# Patient Record
Sex: Male | Born: 1960
Health system: Southern US, Community
[De-identification: ages and names within clinical notes are randomized; demographics above are authoritative.]

## PROBLEM LIST (undated history)

## (undated) DIAGNOSIS — M1990 Unspecified osteoarthritis, unspecified site: Secondary | ICD-10-CM

## (undated) DIAGNOSIS — G4730 Sleep apnea, unspecified: Secondary | ICD-10-CM

## (undated) DIAGNOSIS — E291 Testicular hypofunction: Secondary | ICD-10-CM

## (undated) DIAGNOSIS — R51 Headache: Secondary | ICD-10-CM

## (undated) DIAGNOSIS — F329 Major depressive disorder, single episode, unspecified: Secondary | ICD-10-CM

## (undated) DIAGNOSIS — F32A Depression, unspecified: Secondary | ICD-10-CM

## (undated) DIAGNOSIS — Z5189 Encounter for other specified aftercare: Secondary | ICD-10-CM

## (undated) DIAGNOSIS — R519 Headache, unspecified: Secondary | ICD-10-CM

## (undated) DIAGNOSIS — G8929 Other chronic pain: Secondary | ICD-10-CM

## (undated) DIAGNOSIS — E7800 Pure hypercholesterolemia, unspecified: Secondary | ICD-10-CM

## (undated) HISTORY — PX: FRACTURE SURGERY: SHX138

## (undated) HISTORY — DX: Other chronic pain: G89.29

## (undated) HISTORY — DX: Encounter for other specified aftercare: Z51.89

## (undated) HISTORY — DX: Headache, unspecified: R51.9

## (undated) HISTORY — DX: Headache: R51

## (undated) HISTORY — DX: Sleep apnea, unspecified: G47.30

## (undated) HISTORY — DX: Depression, unspecified: F32.A

## (undated) HISTORY — DX: Major depressive disorder, single episode, unspecified: F32.9

## (undated) HISTORY — DX: Pure hypercholesterolemia, unspecified: E78.00

## (undated) HISTORY — DX: Unspecified osteoarthritis, unspecified site: M19.90

## (undated) HISTORY — DX: Testicular hypofunction: E29.1

---

## 1992-04-11 HISTORY — PX: SPINE SURGERY: SHX786

## 2005-08-16 ENCOUNTER — Ambulatory Visit: Payer: Self-pay | Admitting: Cardiology

## 2005-09-21 ENCOUNTER — Encounter: Payer: Self-pay | Admitting: Pulmonary Disease

## 2005-09-21 ENCOUNTER — Ambulatory Visit (HOSPITAL_BASED_OUTPATIENT_CLINIC_OR_DEPARTMENT_OTHER): Admission: RE | Admit: 2005-09-21 | Discharge: 2005-09-21 | Payer: Self-pay | Admitting: Cardiology

## 2005-09-22 ENCOUNTER — Ambulatory Visit: Payer: Self-pay | Admitting: Pulmonary Disease

## 2006-01-02 ENCOUNTER — Ambulatory Visit: Payer: Self-pay | Admitting: Pulmonary Disease

## 2006-10-06 ENCOUNTER — Ambulatory Visit: Payer: Self-pay | Admitting: Cardiology

## 2006-10-18 ENCOUNTER — Ambulatory Visit: Payer: Self-pay | Admitting: Pulmonary Disease

## 2006-12-01 ENCOUNTER — Ambulatory Visit: Payer: Self-pay | Admitting: Pulmonary Disease

## 2006-12-13 ENCOUNTER — Ambulatory Visit (HOSPITAL_BASED_OUTPATIENT_CLINIC_OR_DEPARTMENT_OTHER): Admission: RE | Admit: 2006-12-13 | Discharge: 2006-12-13 | Payer: Self-pay | Admitting: Pulmonary Disease

## 2006-12-13 ENCOUNTER — Encounter: Payer: Self-pay | Admitting: Pulmonary Disease

## 2007-01-01 ENCOUNTER — Ambulatory Visit: Payer: Self-pay | Admitting: Pulmonary Disease

## 2007-01-11 ENCOUNTER — Ambulatory Visit: Payer: Self-pay | Admitting: Pulmonary Disease

## 2007-03-28 DIAGNOSIS — G4733 Obstructive sleep apnea (adult) (pediatric): Secondary | ICD-10-CM | POA: Insufficient documentation

## 2007-06-19 ENCOUNTER — Ambulatory Visit: Payer: Self-pay | Admitting: Pulmonary Disease

## 2007-06-19 DIAGNOSIS — G4710 Hypersomnia, unspecified: Secondary | ICD-10-CM | POA: Insufficient documentation

## 2008-01-04 ENCOUNTER — Ambulatory Visit: Payer: Self-pay | Admitting: Pulmonary Disease

## 2008-02-19 ENCOUNTER — Telehealth (INDEPENDENT_AMBULATORY_CARE_PROVIDER_SITE_OTHER): Payer: Self-pay | Admitting: *Deleted

## 2008-03-26 ENCOUNTER — Telehealth (INDEPENDENT_AMBULATORY_CARE_PROVIDER_SITE_OTHER): Payer: Self-pay | Admitting: *Deleted

## 2008-07-08 ENCOUNTER — Encounter: Payer: Self-pay | Admitting: Pulmonary Disease

## 2008-07-14 ENCOUNTER — Telehealth (INDEPENDENT_AMBULATORY_CARE_PROVIDER_SITE_OTHER): Payer: Self-pay | Admitting: *Deleted

## 2008-07-21 ENCOUNTER — Telehealth (INDEPENDENT_AMBULATORY_CARE_PROVIDER_SITE_OTHER): Payer: Self-pay | Admitting: *Deleted

## 2008-12-01 ENCOUNTER — Telehealth: Payer: Self-pay | Admitting: Pulmonary Disease

## 2008-12-05 ENCOUNTER — Ambulatory Visit: Payer: Self-pay | Admitting: Pulmonary Disease

## 2010-03-10 ENCOUNTER — Emergency Department (HOSPITAL_COMMUNITY)
Admission: EM | Admit: 2010-03-10 | Discharge: 2010-03-10 | Payer: Self-pay | Source: Home / Self Care | Admitting: Emergency Medicine

## 2010-03-10 ENCOUNTER — Encounter: Payer: Self-pay | Admitting: Cardiology

## 2010-03-11 ENCOUNTER — Ambulatory Visit: Payer: Self-pay | Admitting: Cardiology

## 2010-03-11 DIAGNOSIS — R079 Chest pain, unspecified: Secondary | ICD-10-CM | POA: Insufficient documentation

## 2010-03-22 ENCOUNTER — Telehealth (INDEPENDENT_AMBULATORY_CARE_PROVIDER_SITE_OTHER): Payer: Self-pay | Admitting: Radiology

## 2010-03-23 ENCOUNTER — Ambulatory Visit: Payer: Self-pay

## 2010-03-23 ENCOUNTER — Encounter: Payer: Self-pay | Admitting: Cardiology

## 2010-03-23 ENCOUNTER — Encounter (HOSPITAL_COMMUNITY)
Admission: RE | Admit: 2010-03-23 | Discharge: 2010-05-11 | Payer: Self-pay | Source: Home / Self Care | Attending: Cardiology | Admitting: Cardiology

## 2010-03-29 ENCOUNTER — Telehealth: Payer: Self-pay | Admitting: Cardiology

## 2010-05-11 NOTE — Assessment & Plan Note (Signed)
Summary: rov for osa and idiopathic hypersomnia   Referred by:  Dohmeier/Hochrein  Chief Complaint:  Pt is here for a follow up.  Pt states he uses his cpap machine 3 to 4 nights per week.  Pt denied any complaints with pressure or mask.  Pt states Provigil causes pt to be irritable, forgetful, and short-tempered. Pt would like to discuss possible alternative meds. Pt states he is exercising more and also quit smoking!!.  History of Present Illness: The pt comes in today for f/u of his osa and idiopathic hypersomnia.  He has been wearing cpap comnpliantly for 4-5 nights a week, and has been taking provigil during the day for his hypersomnia.  He is wondering if provigil can cause changes in his personality and temper, but he is also in the process of smoking cessation, which can contribute.  I also pointed out to him that 2-3 nights of not wearing cpap can also contribute to chronic sleep deprivation.     Current Allergies: No known allergies     Risk Factors:  Tobacco use:  quit   Review of Systems      See HPI   Vital Signs:  Patient Profile:   50 Years Old Male Weight:      194.50 pounds O2 Sat:      96 % O2 treatment:    Room Air Temp:     98.1 degrees F oral Pulse rate:   62 / minute BP sitting:   116 / 80  (left arm) Cuff size:   regular  Vitals Entered By: Cyndia Diver LPN (January 04, 2008 3:37 PM)             Comments Medications reviewed with patient Cyndia Diver LPN  January 04, 2008 3:38 PM      Physical Exam  General:     thin male in nad Nose:     no skin breakdown or pressure necrosis from the cpap mask      Impression & Recommendations:  Problem # 1:  OBSTRUCTIVE SLEEP APNEA (ICD-327.23) The pt has no compliants about the cpap, and I have asked him to try and increase his compliance if possible.  Problem # 2:  HYPERSOMNIA, IDIOPATHIC (ICD-780.54) The provigil has greatly improved his daytime alertness, and has allowed him to  function.  I wonder if his symptoms that he is describing are more due to lack of complete compliance with cpap, or possibly due to smoking cessation.  I am willing to try him on nuvigil to see if these symptoms persist.   Patient Instructions: 1)  change provigil to nuvigil 150mg  a day to see if less side effects. 2)  wear cpap as much as possible. 3)  f/u with me in 6mos or sooner if problems   ]

## 2010-05-11 NOTE — Progress Notes (Signed)
Summary: PRESCRIPT  LMTCBx1- returned call  Phone Note Call from Patient   Caller: Patient Call For: [facility] Summary of Call: NEED PRESCRIPT FOR NURIGIL 150 [facility] [id number] Initial call taken by: Rickard Patience,  February 19, 2008 2:53 PM  Follow-up for Phone Call        Pt was switched from Provigil to [place] on 01-04-08. Just need to verifiy pt doing good with switch and need to ok with [facility] for refills.  LM with pt's family member for pt to call me back.  Cyndia Diver LPN  February 19, 2008 4:23 PM  pt's daughter Tilda Burrow returned call from yesterday. call home # [phone] or after 1 hr call cell [phone]. Tivis Ringer  February 20, 2008 10:11 AM    Additional Follow-up for Phone Call Additional follow up Details #1::        pt never had rx for nuvigil, per kc call in nuvigil 150 1 by mouth once daily #30 no refills let kc know how this works for him-[place]--daughter aware med called in and to call and give kc feed back on this Additional Follow-up by: Philipp Deputy CMA,  February 20, 2008 4:31 PM    New/Updated Medications: NUVIGIL 150 MG TABS (ARMODAFINIL) 1 by mouth once daily   Prescriptions: NUVIGIL 150 MG TABS (ARMODAFINIL) 1 by mouth once daily  #30 x 0   Entered by:   Philipp Deputy CMA   Authorized by:   Barbaraann Share MD   Signed by:   Philipp Deputy CMA on 02/20/2008   Method used:   Telephoned to ...       [facility]  [address]  [id number]       [address]       [place], [place]  [address]       Ph: [phone] or [phone]       Fax: [phone]   RxID:   [id number]

## 2010-05-11 NOTE — Assessment & Plan Note (Signed)
Summary: f/u osa/idiopathic hypersomnia   Referred by:  Dohmeier/Hochrein  Chief Complaint:  Follow up. pt denied any complaints.  .  History of Present Illness: the patient comes in today for follow-up of his sleep apnea as well as idiopathic hypersomnia.  The patient's been compliant with CPAP, and is actually doing much better with the device from last year.  He feels that it has helped him sleep better during the night and does have some improvement in daytime sleepiness.  He has continued to have persistent daytime sleepiness despite this, and has been documented by MSLT (but not consistent with narcolepsy).  There have been no other etiologies found, but I remain a little concerned whether there is something neurologic going on here?  The pt is currently being treated with provigil, which helps significantly.  He does fairly well in the am, but has difficulty in the late afternoons.     Current Allergies: No known allergies   Past Medical History:    Current Problems:     OBSTRUCTIVE SLEEP APNEA (ICD-327.23)           Vital Signs:  Patient Profile:   50 Years Old Male Weight:      187 pounds O2 Sat:      95 % O2 treatment:    Room Air Temp:     98.1 degrees F oral Pulse rate:   66 / minute BP sitting:   122 / 70  (left arm) Cuff size:   large  Vitals Entered By: Cyndia Diver LPN (June 19, 2007 4:06 PM)             Comments Medications reviewed with patient. ..................................................................Marland KitchenCyndia Diver LPN  June 19, 2007 4:07 PM      Physical Exam  General:     thin wm in nad Nose:      no skin breakdown or pressure necrosis from the CPAP mask     Impression & Recommendations:  Problem # 1:  OBSTRUCTIVE SLEEP APNEA (ICD-327.23) the patient is doing much better with CPAP compliance.  He feels that it is helping his sleep and daytime symptoms.  He is having no difficulty with the pressure or mask fit.   Problem # 2:   HYPERSOMNIA, IDIOPATHIC (ICD-780.54) the patient is staying functional at this time, but does require Provigil in order for him to accomplish this.  I have also talked with him about drinking caffeinated beverages in order to help with alertness, as well as taking short daytime naps if able to try and refresh him.  It is absolutely critical that he gets 7 to 8 hours of sleep nightly.    Patient Instructions: 1)  f/u 6 mos or sooner if problems. 2)  no change in meds. 3)  use caffeine as needed, and take short naps during the day if able    ]

## 2010-05-11 NOTE — Progress Notes (Signed)
Summary: nuvigil- pt out  Phone Note Call from Patient   Caller: Patient Call For: clance Summary of Call: pt has made an appt for this week to see kc. he is out of nuvigil and would appreciate this be called in asap. [facility] [address] [facility] [id number] Initial call taken by: Tivis Ringer,  December 01, 2008 11:22 AM  Follow-up for Phone Call        megan---is this ok to send the rx in for nuvigil for this pt--he is scheduled to see [facility] on friday.  thanks Marijo File CMA  December 01, 2008 11:24 AM   Will forward to [facility] to ask him.  Aundra Millet Reynolds LPN  December 01, 2008 11:58 AM   Additional Follow-up for Phone Call Additional follow up Details #1::        ok to send him #30 with no fills. Additional Follow-up by: Barbaraann Share MD,  December 01, 2008 4:37 PM    Prescriptions: NUVIGIL 150 MG TABS (ARMODAFINIL) 1 by mouth once daily  #30 x 0   Entered by:   Carron Curie CMA   Authorized by:   Barbaraann Share MD   Signed by:   Carron Curie CMA on 12/01/2008   Method used:   Telephoned to ...       [facility] [address] [id number]       [address]       [place], [place]  [address]       Ph: [phone] or [phone]       Fax: [phone]   RxID:   [id number]

## 2010-05-11 NOTE — Progress Notes (Signed)
Summary: re-faxing CMN  Phone Note From Other Clinic   Caller: ann w/ [facility] Call For: clance Summary of Call: caller: ann is re-faxing request for CMN. says she faxed one in march and hasn't received anything back. caller's # is [phone] x [phone].     Initial call taken by: Tivis Ringer,  July 14, 2008 12:14 PM  Follow-up for Phone Call        Aundra Millet could you please look to see if we have received this CMN.  Thanks. I will be glad to call them back and advise CMN received and for them not to refax.  I looked through [facility] stack of paperwork and did not see anything on this pt.  Called Ann at [place] and LM to re-fax paperwork.  Aundra Millet Reynolds LPN  July 14, 4780 2:58 PM  Follow-up by: Cloyde Reams RN,  July 14, 2008 12:21 PM  Additional Follow-up for Phone Call Additional follow up Details #1::        received paperwork.  Put in [facility] cabinet for him to sign.  Arman Filter LPN  July 14, 9560 3:32 PM

## 2010-05-11 NOTE — Assessment & Plan Note (Signed)
Summary: rov for osa/idiopathic hypersomnia   Copy to:  Dohmeier/Hochrein Primary Provider/Referring Provider:  [facility] [address]  CC:  Pt is here for a f/u appt.   Pt states he is wearing his cpap machine every night and "all night long."  Pt denied any complaints with mask or pressure.  Pt denied any new complaints.  Pt requests rx for Nuvigil.  History of Present Illness: The pt comes in today for f/u of his osa and idiopathic hypersomnia.  He is wearing cpap compliantly, and taking nuvigil qam to maintain daytime functionality.  He is maintaining adequate sleep hygeine.  He has issues 1-2 nights out of 7 where he will awaken about 4am with a few mask leaks, and pull off and sleep for another hour or so.  The other nights he wears until alarm clock goes off.  He thinks this is simply due to shifting of mask during sleep.  Medications Prior to Update: 1)  Nuvigil 150 Mg Tabs (Armodafinil) .Marland Kitchen.. 1 By Mouth Once Daily  Allergies (verified): No Known Drug Allergies  Review of Systems      See HPI  Vital Signs:  Patient profile:   50 year old male Height:      72 inches Weight:      195.13 pounds BMI:     26.56 O2 Sat:      98 % on Room air Temp:     98.2 degrees F oral Pulse rate:   80 / minute BP sitting:   122 / 90  (right arm) Cuff size:   regular  Vitals Entered By: Arman Filter LPN (December 05, 2008 9:02 AM)  O2 Flow:  Room air CC: Pt is here for a f/u appt.   Pt states he is wearing his cpap machine every night, "all night long."  Pt denied any complaints with mask or pressure.  Pt denied any new complaints.  Pt requests rx for Nuvigil Comments Medications reviewed with patient Arman Filter LPN  December 05, 2008 9:02 AM    Physical Exam  General:  wd male in nad Nose:  no skin breakdown or pressure necrosis from cpap mask Heart:  rrr Extremities:  no edema  Neurologic:  alert, but clearly sleepy moves all 4.   Impression & Recommendations:  Problem #  1:  OBSTRUCTIVE SLEEP APNEA (ICD-327.23) the pt is wearing cpap compliantly, and misses out on only a few hrs 2 mornings a week.  His mask fits well, and he has no pressure issues.   Problem # 2:  HYPERSOMNIA, IDIOPATHIC (ICD-780.54) the pt is at least functioning with nuvigil.  I have reminded him to  keep up with his sleep hygeine, naps when able, and caffeine as needed.  Other Orders: Est. Patient Level III (63016)  Patient Instructions: 1)  Please schedule a follow-up appointment in 6 months. 2)  let me know if problems in interim 3)  please see primary md about physical.

## 2010-05-11 NOTE — Progress Notes (Signed)
Summary: Nuvigil refill  Phone Note Call from Patient Call back at [phone]   Caller: Patient Call For: clance Reason for Call: Refill Medication, Talk to Nurse Summary of Call: pt doing well on Nuvigil, pharmacy states they faxed request for refill on 03/25/08.  Not showing in computer, can we call in approval w[facility]refills? [facility] [address] Initial call taken by: Eugene Gavia,  March 26, 2008 4:35 PM  Follow-up for Phone Call        Pt states he is doing some better on Nuvigil. The irritability is not as bad. OK to refill this med? Michel Bickers [facility]  March 26, 2008 4:44 PM  Additional Follow-up for Phone Call Additional follow up Details #1::        ok to fill nuvigil with 6 fills (30)    Pt aware RX called to his pharmacy. Michel Bickers [facility]  March 26, 2008 4:56 PM      Prescriptions: NUVIGIL 150 MG TABS (ARMODAFINIL) 1 by mouth once daily  #30 x 5   Entered by:   Michel Bickers CMA   Authorized by:   Barbaraann Share MD   Signed by:   Michel Bickers CMA on 03/26/2008   Method used:   Telephoned to ...       [facility]  [address]  [id number]       [address]       [place], [place]  [address]       Ph: [phone] or [phone]       Fax: [phone]   RxID:   [id number]

## 2010-05-11 NOTE — Letter (Signed)
Summary: CMN for CPAP Supplies/[facility]  CMN for CPAP Supplies/[facility]   Imported By: Sherian Rein 07/25/2008 08:38:58  _____________________________________________________________________  External Attachment:    Type:   Image     Comment:   External Document

## 2010-05-11 NOTE — Assessment & Plan Note (Signed)
Summary: np6/pt last seen by dr hochrein/lg   Visit Type:  Follow-up Primary Provider:  Dr. Faustino Congress   History of Present Illness: 50 yo with history of chronic fatigue, OSA, and smoking presents for evaluation of chest pain.  Patient woke up in the early morning on 11/30 with central chest pressure and shortness of breath that lasted 1.5 hours total.  He went back to bed, then got up later and went to [facility].  There, he was thought to have a T wave abnormality on his ECG (this ECG is not available) and was sent to the ER.  At the [facility] ER, his ECGs were normal.  Cardiac enyzmes were negative x 2.  He has had no chest pain since that time.  He had no pain prior to this.  He saw cardiology in the past for fatigue with unremarkable workup at that time.  He continues to smoke cigarettes.    ECG: NSR, normal  Labs (03/10/10): cardiac enzymes negative x 2, K 4.5, creatinine 1.01  Current Medications (verified): 1)  Androgel .... Uad  Allergies (verified): No Known Drug Allergies  Past History:  Past Medical History: 1. OBSTRUCTIVE SLEEP APNEA (ICD-327.23): uses CPAP 2. Mild Bradycardia: short pauses in his sleep on past monitor, may have been related to OSA.  3. Chronic fatigue 4. Low testosterone 5. Active smoker  Family History: Grandfather with MI at 67  Social History: Tobacco Use - Yes, 1/2 ppd Married, lives in [place] Works for [employer] in [place]  Review of Systems       All systems reviewed and negative except as per HPI.   Vital Signs:  Patient profile:   50 year old male Height:      72 inches Weight:      200 pounds BMI:     27.22 Pulse rate:   63 / minute BP sitting:   130 / 82  (left arm)  Vitals Entered By: Laurance Flatten CMA (March 11, 2010 4:11 PM)  Physical Exam  General:  Well developed, well nourished, in no acute distress. Head:  normocephalic and atraumatic Nose:  no deformity, discharge, inflammation, or lesions Mouth:   Teeth, gums and palate normal. Oral mucosa normal. Neck:  Neck supple, no JVD. No masses, thyromegaly or abnormal cervical nodes. Lungs:  Clear bilaterally to auscultation and percussion. Heart:  Non-displaced PMI, chest non-tender; regular rate and rhythm, S1, S2 without murmurs, rubs or gallops. Carotid upstroke normal, no bruit. Pedals normal pulses. No edema, no varicosities. Abdomen:  Bowel sounds positive; abdomen soft and non-tender without masses, organomegaly, or hernias noted. No hepatosplenomegaly. Msk:  Back normal, normal gait. Muscle strength and tone normal. Extremities:  No clubbing or cyanosis. Neurologic:  Alert and oriented x 3. Skin:  Intact without lesions or rashes. Psych:  Normal affect.   Impression & Recommendations:  Problem # 1:  CHEST PAIN UNSPECIFIED (ICD-786.50) Prolonged episode of somewhat atypical chest pain.  His main risk factor is smoking.  He was said to have a T wave abnormality on ECG at PCP's office on the days he had the chest pain.  However, ECG at the ER later that day and ECG today are both normal.  He will take ASA 81 mg daily and I will arrange an ETT-myoview.   Problem # 2:  SMOKER I strongly encouraged him to quit.  He wants to try an electronic cigarrette.   Will call for last lipid draw from PCP.   Other Orders:  Nuclear Stress Test (Nuc Stress Test)  Patient Instructions: 1)  Your physician has recommended you make the following change in your medication:  2)  Start Aspirin 81mg  daily--this should be enteric coated. 3)  Use the electronic cigarette to help you stop smoking. 4)  Your physician has requested that you have an exercise stress myoview.  For further information please visit [email or web address]  Please follow instruction sheet, as given. 5)  Your physician recommends that you schedule a follow-up appointment as needed with Dr Shirlee Latch.

## 2010-05-11 NOTE — Progress Notes (Signed)
Summary: looking for paperwork  Phone Note Outgoing Call Call back at [phone] [phone]   Call placed by: [facility] Call placed to: clance Summary of Call: looking for paperwork on pt's cpap supplies...faxed over on July 08, 2008, has been faxed two more times since then.  Patton Salles is rep that called from [place]. Initial call taken by: Eugene Gavia,  July 21, 2008 1:25 PM  Follow-up for Phone Call        Aundra Millet, do you have this paperwork?  Could you please have KC sign.  Thanks:)   yes, we have paperwork.  i'll have kc sign it at the end of the day today.  Aundra Millet Reynolds LPN  July 21, 2008 2:40 PM   Follow-up by: Cloyde Reams RN,  July 21, 2008 2:14 PM  Additional Follow-up for Phone Call Additional follow up Details #1::        Called advised paperwork has been received and per Aundra Millet will be signed by end of the day, and we will fax back. Additional Follow-up by: Cloyde Reams RN,  July 21, 2008 3:01 PM

## 2010-05-13 NOTE — Progress Notes (Signed)
Summary: nuc pre-procedure  Phone Note Outgoing Call   Call placed by: Harlow Asa CNMT Call placed to: Patient Reason for Call: Confirm/change Appt Summary of Call: Reviewed information on Myoview Information Sheet (see scanned document for further details).  Spoke with patient.      Nuclear Med Background Indications for Stress Test: Evaluation for Ischemia, Post Hospital  Indications Comments: 03/10/10 [facility] CP / Pressure ( - enzymes)   History Comments: OSA ( CPAP)  Symptoms: Chest Pain, Chest Pressure    Nuclear Pre-Procedure Cardiac Risk Factors: Family History - CAD, Smoker Height (in): 72

## 2010-05-13 NOTE — Progress Notes (Signed)
Summary: pt returned call  Phone Note Call from Patient   Caller: Patient [phone] Reason for Call: Talk to Nurse, Lab or Test Results Summary of Call: pt returned call re results -pls call Initial call taken by: Glynda Jaeger,  March 29, 2010 10:47 AM  Follow-up for Phone Call        adv pt of stress test results. pt expressed understanding.  Follow-up by: Claris Gladden RN,  March 29, 2010 11:21 AM

## 2010-05-13 NOTE — Assessment & Plan Note (Signed)
Summary: Cardiology Nuclear Testing  Nuclear Med Background Indications for Stress Test: Evaluation for Ischemia, Post Hospital  Indications Comments: 03/10/10 [facility] CP / Pressure ( - enzymes)   History Comments: OSA ( CPAP)  Symptoms: Chest Pain, Chest Pressure    Nuclear Pre-Procedure Cardiac Risk Factors: Family History - CAD, Smoker Caffeine/Decaff Intake: None NPO After: 7:00 PM Lungs: clear IV 0.9% NS with Angio Cath: 22g     IV Site: R Antecubital IV Started by: Irean Hong, RN Chest Size (in) 44     Height (in): 72 Weight (lb): 197 BMI: 26.81  Nuclear Med Study 1 or 2 day study:  1 day     Stress Test Type:  Stress Reading MD:  Olga Millers, MD     Referring MD:  D.McLean Resting Radionuclide:  Technetium 104m Tetrofosmin     Resting Radionuclide Dose:  11 mCi  Stress Radionuclide:  Technetium 58m Tetrofosmin     Stress Radionuclide Dose:  33 mCi   Stress Protocol Exercise Time (min):  10:00 min     Max HR:  157 bpm     Predicted Max HR:  172 bpm  Max Systolic BP: 181 mm Hg     Percent Max HR:  91.28 %     METS: 11.7 Rate Pressure Product:  32355    Stress Test Technologist:  Milana Na, EMT-P     Nuclear Technologist:  Domenic Polite, CNMT  Rest Procedure  Myocardial perfusion imaging was performed at rest 45 minutes following the intravenous administration of Technetium 58m Tetrofosmin.  Stress Procedure  The patient exercised for 10:00. The patient stopped due to fatigue and denied any chest pain.  There were no significant ST-T wave changes.  Technetium 2m Tetrofosmin was injected at peak exercise and myocardial perfusion imaging was performed after a brief delay.  QPS Raw Data Images:  Acquisition technically good; normal left ventricular size. Stress Images:  Normal homogeneous uptake in all areas of the myocardium. Rest Images:  Normal homogeneous uptake in all areas of the myocardium. Subtraction (SDS):  No evidence of ischemia. Transient  Ischemic Dilatation:  .93  (Normal <1.22)  Lung/Heart Ratio:  .32  (Normal <0.45)  Quantitative Gated Spect Images QGS EDV:  108 ml QGS ESV:  49 ml QGS EF:  54 % QGS cine images:  Normal wall motion.   Overall Impression  Exercise Capacity: Good exercise capacity. BP Response: Normal blood pressure response. Clinical Symptoms: No chest pain ECG Impression: No significant ST segment change suggestive of ischemia. Overall Impression: Normal stress nuclear study with no ischemia or infarction.  Appended Document: Cardiology Nuclear Testing normal study  Appended Document: Cardiology Nuclear Testing [facility]  Appended Document: Cardiology Nuclear Testing adv pt of lab results. Claris Gladden, RN, BSN

## 2010-06-23 LAB — DIFFERENTIAL
Basophils Absolute: 0 10*3/uL (ref 0.0–0.1)
Basophils Relative: 1 % (ref 0–1)
Eosinophils Absolute: 0.2 10*3/uL (ref 0.0–0.7)
Eosinophils Relative: 4 % (ref 0–5)
Lymphocytes Relative: 26 % (ref 12–46)
Lymphs Abs: 1.6 10*3/uL (ref 0.7–4.0)
Monocytes Absolute: 0.5 10*3/uL (ref 0.1–1.0)
Monocytes Relative: 8 % (ref 3–12)
Neutro Abs: 3.7 10*3/uL (ref 1.7–7.7)
Neutrophils Relative %: 62 % (ref 43–77)

## 2010-06-23 LAB — BASIC METABOLIC PANEL
BUN: 7 mg/dL (ref 6–23)
CO2: 29 mEq/L (ref 19–32)
Calcium: 9 mg/dL (ref 8.4–10.5)
Chloride: 104 mEq/L (ref 96–112)
Creatinine, Ser: 1.01 mg/dL (ref 0.4–1.5)
GFR calc Af Amer: >60 mL/min (ref >60)
GFR calc non Af Amer: >60 mL/min (ref >60)
Glucose, Bld: 91 mg/dL (ref 70–99)
Potassium: 4.5 mEq/L (ref 3.5–5.1)
Sodium: 138 mEq/L (ref 135–145)

## 2010-06-23 LAB — POCT CARDIAC MARKERS
CKMB, poc: <1 ng/mL — ABNORMAL LOW (ref 1.0–8.0)
CKMB, poc: <1 ng/mL — ABNORMAL LOW (ref 1.0–8.0)
Myoglobin, poc: 60.9 ng/mL (ref 12–200)
Myoglobin, poc: 62.8 ng/mL (ref 12–200)
Troponin i, poc: 0.06 ng/mL (ref 0.00–0.09)
Troponin i, poc: <0.05 ng/mL (ref 0.00–0.09)

## 2010-06-23 LAB — CBC
HCT: 46.6 % (ref 39.0–52.0)
Hemoglobin: 17 g/dL (ref 13.0–17.0)
MCH: 32.9 pg (ref 26.0–34.0)
MCHC: 36.5 g/dL — ABNORMAL HIGH (ref 30.0–36.0)
MCV: 90.3 fL (ref 78.0–100.0)
Platelets: 141 10*3/uL — ABNORMAL LOW (ref 150–400)
RBC: 5.16 MIL/uL (ref 4.22–5.81)
RDW: 12.7 % (ref 11.5–15.5)
WBC: 6 10*3/uL (ref 4.0–10.5)
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
## 2010-08-24 NOTE — Assessment & Plan Note (Signed)
[facility]                             PULMONARY OFFICE NOTE   CECIL, VANDYKE                          MRN:          [id number]  DATE:10/18/2006                            DOB:          01-14-1961    SUBJECTIVE:  Mr. Tomes comes in today for followup of his obstructive  sleep apnea.  The patient was last seen in September 2007 where he was  started on CPAP for mild obstructive sleep apnea.  The patient was to  return in 4 weeks but unfortunately did not show for the visit and has  not been heard from since.  The patient apparently tried the CPAP  several months and felt that he could not tolerate and therefore  discontinued on his own.  He has been having trouble with significant  daytime sleepiness and lack of concentration that is now affecting his  work.  The patient states the CPAP never really helped him; however, he  never had his pressure optimized, and was also wearing his mask half on  and half off like a Pharmacist, hospital.  Obviously, this would not deliver  appropriate pressure nor would it help him from a sleep apnea  standpoint.   PHYSICAL EXAMINATION:  In general he is a thin male who definitely  appears sleepy.  Blood pressure 120/78, pulse 63, temperature is 97.5.  Weight is 177  pounds.  O2 saturation on room air is 96%.   IMPRESSION:  Excessive daytime sleepiness that may be due to his  obstructive sleep apnea.  I have explained to him again that there are  some patients who are very symptomatic from mild sleep apnea and there  are others who do not even know they have it.  I still think CPAP is the  easiest therapeutic trial for him to see if he has significant  improvement in his daytime sleepiness.  If he does not, after a good  therapeutic trial on CPAP, obviously this is not the etiology for his  daytime sleepiness and we would have to do further work up  to try and  ascertain a specific etiology.  I have explained to him that  he has  really not given CPAP a fair trial,  from both a pressure optimization  standpoint and from appropriate use standpoint.  At this point in time I  have recommended that we give him a trial of an auto-set where the  pressure is variable and may also allow him to tolerate the CPAP better.  I also talked with him about the points of a good mask fit and how there  has to be an adequate seal.  The patient is agreeable to giving this a  try.   PLAN:  1. Will get auto-set machine for the next 3 weeks to see if he has      significant improvement in his symptoms.  I told the patient again,      that if he finds there is poor tolerance, that he needs to call me      immediately so that we  can trouble shoot the device and give this a      fair      chance.  He has voiced understanding.  2. The patient will follow up in 3 weeks or sooner if there is      problems.     Barbaraann Share, MD,FCCP  Electronically Signed    KMC/MedQ  DD: 10/18/2006  DT: 10/18/2006  Job #: [id number]   cc:   Rollene Rotunda, MD, [facility]  Neurolog[facility] [place]

## 2010-08-24 NOTE — Procedures (Signed)
NAME:  Jeremy Guerra, Jeremy Guerra NO.:  [id number]   MEDICAL RECORD NO.:  [id number]          PATIENT TYPE:  OUT   LOCATION:  SLEEP CENTER                 FACILITY:  [facility]   PHYSICIAN:  Barbaraann Share, MD,FCCPDATE OF BIRTH:  06/03/1961   DATE OF STUDY:  12/13/2006                          MULTIPLE SLEEP LATENCY TEST   REFERRING PHYSICIAN:   INDICATION FOR STUDY:  Persistent hypersomnia despite aggressive  treatment of mild obstructive sleep apnea with CPAP.   DATA:   NAP 1:  Sleep onset latency of 5 minutes with latency to REM not  applicable.  Nap time #1 is 8 a.m.   NAP 2:  Time is 10 a.m.  Sleep onset latency is 3 minutes and REM  latency is not applicable.   NAP 3:  Time is 12 p.m.  Sleep onset latency is 1 minute and REM onset  is not applicable.   NAP 4:  Time is 2 p.m.  Sleep onset latency is 8 minutes.  REM onset is  not applicable.   NAP 5:  Time is 4 p.m.  Sleep onset latency is 4.5 minutes.  Latency to  REM is 9.5 minutes.    MEAN SLEEP LATENCY:  4.3 minutes   NUMBER OF REM EPISODES:  One   COMMENTS:  It should be noted the patient has a history of mild  obstructive sleep apnea and has been on optimal CPAP for this.  Despite  treatment, the patient has continued to have significant daytime  hypersomnolence.  It should also be noted the tech noted moderate to  loud intermittent snoring during the study.   IMPRESSIONS/RECOMMENDATIONS:  MSLT shows a mean sleep latency of 4.3  minutes which is consistent with objective daytime sleepiness.  The  patient did have one REM episode that may suggest the possible diagnosis  of narcolepsy.  Clinical correlation is suggested.      Barbaraann Share, MD,FCCP  Diplomate, American Board of Sleep  Medicine  Electronically Signed     KMC/MEDQ  D:  01/01/2007 08:31:28  T:  01/01/2007 11:24:13  Job:  [id number]
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
## 2010-08-27 NOTE — Procedures (Signed)
NAME:  Jeremy Guerra, SICKINGER NO.:  [id number]   MEDICAL RECORD NO.:  [id number]          PATIENT TYPE:  OUT   LOCATION:  SLEEP CENTER                 [facility]ACILITY[facility]  [facility]   PHYSICIAN:  Marcelyn Bruins, M.D. [facility] DATE OF BIRTH:  [birth year]   DATE OF STUDY:  09/21/2005                              NOCTURNAL POLYSOMNOGRAM   REFERRING PHYSICIAN:  Rollene Rotunda, M.D.   INDICATIONS FOR STUDY:  Hypersomnia with sleep apnea.   EPWORTH SCORE:  16.   SLEEP ARCHITECTURE:  The patient had a total sleep time of 310 minutes with  very decreased REM and slow wave sleep.  Sleep onset latency was normal and  8 minutes and REM onset was normal as well as 83 minutes.  Sleep efficiency  was extremely decreased at 69%.   RESPIRATORY DATA:  The patient underwent split night study where he was  found to have 33 obstructive events in the first 161 minutes of sleep.  This  gave him a respiratory disturbance index of 12 events per hour.  The events  occurred in all body positions and there was mild to moderate snoring noted  throughout.  By protocol, the patient was then placed on a medium ResMed  Ultra Mirage full throughout Mirage full face mask and CPAP titration was  initiated.  At a final pressure of 7 cm there was excellent control of both  obstructive events and snoring.   OXYGEN DATA:  The patient had O2 desaturation as low as 89% with his  obstructive event.   CARDIAC DATA:  No clinically significant cardiac arrhythmias.   MOVEMENT-PARASOMNIA:  Small numbers of leg jerks with no significant sleep  disruption.   IMPRESSION/RECOMMENDATIONS:  Split night study reveals mild obstructive  sleep apnea/hypopnea syndrome with a respiratory disturbance index of 12  events per hour during the first half of the night.  The patient was then  placed on a medium ResMed Ultra Mirage full face mask and titrated to a  final pressure of 7 cm.  Other  options for treatment of this degree of sleep apnea  include upper airway  surgery, oral appliance, and also weight loss alone if applicable.  Clinical  correlation is suggested.          ______________________________  Marcelyn Bruins, M.D. [facility]  Diplomate, American Board of Sleep  Medicine     KC/MEDQ  D:  09/22/2005 22:21:27  T:  09/23/2005 00:32:46  Job:  [id number]

## 2011-07-02 ENCOUNTER — Other Ambulatory Visit: Payer: Self-pay | Admitting: *Deleted

## 2011-07-02 MED ORDER — TESTOSTERONE 50 MG/5GM (1%) TD GEL: 5.0000 g | Freq: Every day | TRANSDERMAL | Status: DC

## 2011-08-08 ENCOUNTER — Telehealth: Payer: Self-pay | Admitting: Physician Assistant

## 2011-08-08 MED ORDER — TESTOSTERONE 50 MG/5GM (1%) TD GEL: 5.0000 g | Freq: Every day | TRANSDERMAL | Status: DC

## 2011-08-08 NOTE — Telephone Encounter (Signed)
rx printed

## 2011-08-08 NOTE — Telephone Encounter (Signed)
Rx called in 

## 2011-11-08 ENCOUNTER — Other Ambulatory Visit: Payer: Self-pay | Admitting: Physician Assistant

## 2011-11-08 MED ORDER — TESTOSTERONE 50 MG/5GM (1%) TD GEL: 5.0000 g | Freq: Every day | TRANSDERMAL | Status: DC

## 2012-01-19 ENCOUNTER — Encounter: Payer: Self-pay | Admitting: Family Medicine

## 2012-01-23 ENCOUNTER — Ambulatory Visit (INDEPENDENT_AMBULATORY_CARE_PROVIDER_SITE_OTHER): Payer: Managed Care, Other (non HMO) | Admitting: Family Medicine

## 2012-01-23 ENCOUNTER — Ambulatory Visit: Payer: Managed Care, Other (non HMO)

## 2012-01-23 VITALS — BP 130/80 | HR 80 | Temp 98.1°F | Resp 18 | Ht 71.0 in | Wt 164.2 lb

## 2012-01-23 DIAGNOSIS — L570 Actinic keratosis: Secondary | ICD-10-CM

## 2012-01-23 DIAGNOSIS — F411 Generalized anxiety disorder: Secondary | ICD-10-CM

## 2012-01-23 DIAGNOSIS — Z0000 Encounter for general adult medical examination without abnormal findings: Secondary | ICD-10-CM

## 2012-01-23 DIAGNOSIS — F419 Anxiety disorder, unspecified: Secondary | ICD-10-CM

## 2012-01-23 LAB — COMPREHENSIVE METABOLIC PANEL
ALT: 32 U/L (ref 0–53)
AST: 26 U/L (ref 0–37)
Albumin: 4.8 g/dL (ref 3.5–5.2)
Alkaline Phosphatase: 56 U/L (ref 39–117)
BUN: 7 mg/dL (ref 6–23)
CO2: 28 mEq/L (ref 19–32)
Calcium: 9.8 mg/dL (ref 8.4–10.5)
Chloride: 103 mEq/L (ref 96–112)
Creat: 1.07 mg/dL (ref 0.50–1.35)
Glucose, Bld: 98 mg/dL (ref 70–99)
Potassium: 4.2 mEq/L (ref 3.5–5.3)
Sodium: 142 mEq/L (ref 135–145)
Total Bilirubin: 0.9 mg/dL (ref 0.3–1.2)
Total Protein: 6.9 g/dL (ref 6.0–8.3)

## 2012-01-23 LAB — PSA: PSA: 0.89 ng/mL (ref <=4.00)

## 2012-01-23 LAB — POCT CBC
Granulocyte percent: 80.8 %G — AB (ref 37–80)
HCT, POC: 54.3 % — AB (ref 43.5–53.7)
Hemoglobin: 17.9 g/dL (ref 14.1–18.1)
Lymph, poc: 1.2 (ref 0.6–3.4)
MCH, POC: 31.6 pg — AB (ref 27–31.2)
MCHC: 33 g/dL (ref 31.8–35.4)
MCV: 95.9 fL (ref 80–97)
MID (cbc): 0.4 (ref 0–0.9)
MPV: 13.2 fL (ref 0–99.8)
POC Granulocyte: 6.7 (ref 2–6.9)
POC LYMPH PERCENT: 14.5 %L (ref 10–50)
POC MID %: 4.7 %M (ref 0–12)
Platelet Count, POC: 174 10*3/uL (ref 142–424)
RBC: 5.66 M/uL (ref 4.69–6.13)
RDW, POC: 14.1 %
WBC: 8.3 10*3/uL (ref 4.6–10.2)

## 2012-01-23 LAB — POCT URINALYSIS DIPSTICK
Bilirubin, UA: NEGATIVE
Blood, UA: NEGATIVE
Glucose, UA: NEGATIVE
Ketones, UA: 40
Leukocytes, UA: NEGATIVE
Nitrite, UA: NEGATIVE
Protein, UA: NEGATIVE
Spec Grav, UA: 1.015
Urobilinogen, UA: 0.2
pH, UA: 7

## 2012-01-23 LAB — TSH: TSH: 1.219 u[IU]/mL (ref 0.350–4.500)

## 2012-01-23 LAB — LIPID PANEL
Cholesterol: 148 mg/dL (ref 0–200)
HDL: 53 mg/dL (ref >39)
LDL Cholesterol: 75 mg/dL (ref 0–99)
Total CHOL/HDL Ratio: 2.8 Ratio
Triglycerides: 98 mg/dL (ref <150)
VLDL: 20 mg/dL (ref 0–40)

## 2012-01-23 LAB — IFOBT (OCCULT BLOOD): IFOBT: NEGATIVE

## 2012-01-23 NOTE — Progress Notes (Signed)
@[facility] @  Patient ID: Jeremy Guerra MRN: [id number], DOB: 03-Oct-1960 51 y.o. Date of Encounter: 01/23/2012, 8:36 AM  Primary Physician: Elvina Sidle, MD  Chief Complaint: Physical (CPE)  HPI: 51 y.o. y/o male with history noted below here for CPE.  Patient has a 20 pound unexplained weight loss of the last 6 weeks. He had gotten his weight up until 205 but then didn't exercise program bring it down to 185. In the last 6 weeks he's lost another 20 pounds which he thinks may be related to stress.  Stress comes from his son having been committed to a mental institution after matriculating at [employer] state. During that time he was away on business and his wife is at [employer]. His mother had 2 intervene with some acute anxiety issues with his oldest son. His son is now back at school and seems to be doing okay. At the same time his wife came back from the beach and then went to [place] for a month and now is filing for divorce. His younger son age 75, is having panic attacks.  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 His wife is [place] and patient is fluent in [place] where he was married 20 years ago.  Review of Systems: Consitutional: No fever, chills, fatigue, night sweats, lymphadenopathy Eyes: No visual changes, eye redness, or discharge. ENT/Mouth: Ears: No otalgia, tinnitus, hearing loss, discharge. Nose: No congestion, rhinorrhea, sinus pain, or epistaxis. Throat: No sore throat, post nasal drip, or teeth pain. Cardiovascular: No CP, palpitations, diaphoresis, DOE, edema, orthopnea, PND. Respiratory: No cough, hemoptysis, SOB, or wheezing. Gastrointestinal: No anorexia, dysphagia, reflux, pain, nausea, vomiting, hematemesis, diarrhea, constipation, BRBPR, or melena. Genitourinary: No dysuria, frequency, urgency, hematuria, incontinence, nocturia, decreased urinary stream, discharge, impotence, or testicular  pain/masses. Musculoskeletal: No decreased ROM, myalgias, stiffness, joint swelling, or weakness. Skin: No rash, erythema, lesion changes, pain, warmth, jaundice, or pruritis. Neurological: No headache, dizziness, syncope, seizures, tremors, memory loss, coordination problems, or paresthesias. Psychological: No , hallucinations, SI/HI. Endocrine: No fatigue, polydipsia, polyphagia, polyuria, or known diabetes. All other systems were reviewed and are otherwise negative.  Past Medical History  Diagnosis Date  . Hypogonadism male      Past Surgical History  Procedure Date  . Spine surgery     Home Meds:  Prior to Admission medications   Medication Sig Start Date End Date Taking? Authorizing Provider  testosterone (ANDROGEL) 50 MG/5GM GEL Place 5 g onto the skin daily. Needs office visit for additional refills.  Second notice. 11/08/11   Anders Simmonds, PA-C    Allergies: Not on File  History   Social History  . Marital Status: Married    Spouse Name: N/A    Number of Children: N/A  . Years of Education: N/A   Occupational History  . Not on file.   Social History Main Topics  . Smoking status: Heavy Tobacco Smoker -- 1.0 packs/day    Types: Cigarettes  . Smokeless tobacco: Not on file  . Alcohol Use: Not on file  . Drug Use: Not on file  . Sexually Active: Not on file   Other Topics Concern  . Not on file   Social History Narrative  . No narrative on file    Family History  Problem Relation Age of Onset  . Hypertension Father   . Diabetes Father     Physical Exam: Blood pressure 130/80, pulse 80, temperature 98.1  F (36.7 C), temperature source Oral, resp. rate 18, height 5\' 11"  (1.803 m), weight 164 lb 3.2 oz (74.481 kg), SpO2 98.00%.  General: Well developed, well nourished, in no acute distress. HEENT: Normocephalic, atraumatic. Conjunctiva pink, sclera non-icteric. Pupils 2 mm constricting to 1 mm, round, regular, and equally reactive to light and  accomodation. EOMI. Internal auditory canal clear. TMs with good cone of light and without pathology. Nasal mucosa pink. Nares are without discharge. No sinus tenderness. Oral mucosa pink. Dentition good. Pharynx without exudate.   Neck: Supple. Trachea midline. No thyromegaly. Full ROM. No lymphadenopathy. Lungs: Clear to auscultation bilaterally without wheezes, rales, or rhonchi. Breathing is of normal effort and unlabored. Cardiovascular: RRR with S1 S2. No murmurs, rubs, or gallops appreciated. Distal pulses 2+ symmetrically. No carotid or abdominal bruits Abdomen: Soft, non-tender, non-distended with normoactive bowel sounds. No hepatosplenomegaly or masses. No rebound/guarding. No CVA tenderness. Without hernias.  Rectal: No external hemorrhoids or fissures. Rectal vault without masses.  Genitourinary:  circumcised male. No penile lesions. Testes descended bilaterally, and smooth without tenderness or masses.  Musculoskeletal: Full range of motion and 5/5 strength throughout. Without swelling, atrophy, tenderness, crepitus, or warmth. Extremities without clubbing, cyanosis, or edema. Calves supple. Skin: Warm and moist without erythema, ecchymosis, wounds, or rash. Neuro: A+Ox3. CN II-XII grossly intact. Moves all extremities spontaneously. Full sensation throughout. Normal gait. DTR 2+ throughout upper and lower extremities. Finger to nose intact. Psych:  Responds to questions appropriately with a normal affect.   Studies: CBC, CMET, Lipid, PSA, TSH all pending. Patient is anxious UA:  Results for orders placed in visit on 01/23/12  POCT CBC      Component Value Range   WBC 8.3  4.6 - 10.2 K/uL   Lymph, poc 1.2  0.6 - 3.4   POC LYMPH PERCENT 14.5  10 - 50 %L   MID (cbc) 0.4  0 - 0.9   POC MID % 4.7  0 - 12 %M   POC Granulocyte 6.7  2 - 6.9   Granulocyte percent 80.8 (*) 37 - 80 %G   RBC 5.66  4.69 - 6.13 M/uL   Hemoglobin 17.9  14.1 - 18.1 g/dL   HCT, POC 21.3 (*) 08.6 - 53.7 %    MCV 95.9  80 - 97 fL   MCH, POC 31.6 (*) 27 - 31.2 pg   MCHC 33.0  31.8 - 35.4 g/dL   RDW, POC 57.8     Platelet Count, POC 174  142 - 424 K/uL   MPV 13.2  0 - 99.8 fL  IFOBT (OCCULT BLOOD)      Component Value Range   IFOBT Negative    POCT URINALYSIS DIPSTICK      Component Value Range   Color, UA amber     Clarity, UA clear     Glucose, UA neg     Bilirubin, UA neg     Ketones, UA 40     Spec Grav, UA 1.015     Blood, UA neg     pH, UA 7.0     Protein, UA neg     Urobilinogen, UA 0.2     Nitrite, UA neg     Leukocytes, UA Negative     [facility] reading (PRIMARY) by  Dr. Milus Glazier:  Normal  CXR.   Assessment/Plan:  51 y.o. y/o  male here for CPE - 1. Actinic keratosis  Ambulatory referral to Dermatology, POCT CBC, IFOBT POC (occult bld, rslt in office),  POCT urinalysis dipstick, PSA, TSH, Comprehensive metabolic panel, Lipid panel, Testosterone, free, total, EKG 12-Lead, DG Chest 2 View     Signed, Elvina Sidle, MD 01/23/2012 8:36 AM

## 2012-01-24 LAB — TESTOSTERONE, FREE, TOTAL, SHBG
Sex Hormone Binding: 49 nmol/L (ref 13–71)
Testosterone, Free: 85.8 pg/mL (ref 47.0–244.0)
Testosterone-% Free: 1.6 % (ref 1.6–2.9)
Testosterone: 525.24 ng/dL (ref 300–890)

## 2012-01-30 ENCOUNTER — Other Ambulatory Visit: Payer: Self-pay | Admitting: Family Medicine

## 2013-07-29 ENCOUNTER — Ambulatory Visit (INDEPENDENT_AMBULATORY_CARE_PROVIDER_SITE_OTHER): Payer: Managed Care, Other (non HMO) | Admitting: Pulmonary Disease

## 2013-07-29 ENCOUNTER — Encounter (INDEPENDENT_AMBULATORY_CARE_PROVIDER_SITE_OTHER): Payer: Self-pay

## 2013-07-29 ENCOUNTER — Encounter: Payer: Self-pay | Admitting: Pulmonary Disease

## 2013-07-29 VITALS — BP 136/74 | HR 52 | Temp 98.1°F | Ht 72.0 in | Wt 184.4 lb

## 2013-07-29 DIAGNOSIS — G4710 Hypersomnia, unspecified: Secondary | ICD-10-CM

## 2013-07-29 DIAGNOSIS — G4733 Obstructive sleep apnea (adult) (pediatric): Secondary | ICD-10-CM

## 2013-07-29 DIAGNOSIS — G4711 Idiopathic hypersomnia with long sleep time: Secondary | ICD-10-CM

## 2013-07-29 MED ORDER — ARMODAFINIL 150 MG PO TABS: 150.0000 mg | ORAL_TABLET | Freq: Every day | ORAL | Status: DC | PRN

## 2013-07-29 NOTE — Patient Instructions (Signed)
Continue with cpap, and keep up with mask changes and supplies Will give you a prescription for nuvigil 150mg , and take once a day as needed for sleepiness. Recommend getting with your primary MD to discuss risks and benefits of testosterone therapy. followup with me again in one year if doing well.

## 2013-07-29 NOTE — Progress Notes (Signed)
Subjective:    Patient ID: Jeremy Guerra, male    DOB: 16-Aug-1960, 54 y.o.   MRN: [id number]  HPI The patient is a 53 year old male who comes in today as a self-referral to reestablish for management of obstructive sleep apnea and idiopathic hypersomnia. I last saw him in 2011, and he has been on CPAP for his sleep apnea and Nuvigil for his persistent daytime sleepiness. He saw definite improvement while being on CPAP, and while using his stimulant medication during the day. He has been out of this for quite some time, and has been diagnosed with low testosterone. He has been on replacement therapy and saw a significant improvement in his daytime energy level and fatigue. He currently is having a difficult time sorting out how much of his daytime issue is related to fatigue versus true sleepiness. He stopped using hormone replacement therapy because of his concerns over side effects. He is continued on CPAP compliantly, and feels that he is doing well with the device. He has kept up with his mask changes and supplies. His weight is down 11 pounds from his last visit. Unfortunately, he is only getting 5-6 hours of sleep a night, but CPAP device helps with his sleep efficiency. Again, he is continuing to have daytime fatigue and sleepiness. His Epworth score today is abnormal at 18.   Sleep Questionnaire What time do you typically go to bed?( Between what hours) 11:30-12:00 11:30-12:00 at 1106 on 07/29/13 by Len Blalock, CMA How long does it take you to fall asleep? less than 5 minutes less than 5 minutes at 1106 on 07/29/13 by Len Blalock, CMA How many times during the night do you wake up? 0 0 at 1106 on 07/29/13 by Len Blalock, CMA What time do you get out of bed to start your day? 0530 0530 at 1106 on 07/29/13 by Len Blalock, CMA Do you drive or operate heavy machinery in your occupation? No No at 1106 on 07/29/13 by Len Blalock, CMA How much has your  weight changed (up or down) over the past two years? (In pounds) 15 lb (6.804 kg)15 lb (6.804 kg) decrease at 1106 on 07/29/13 by Len Blalock, CMA Have you ever had a sleep study before? Yes Yes at 1106 on 07/29/13 by Len Blalock, CMA If yes, [facility]? at 1106 on 07/29/13 by Len Blalock, CMA If yes, date of study? 2010 2010 at 1106 on 07/29/13 by Len Blalock, CMA Do you currently use CPAP? Yes Yes at 1106 on 07/29/13 by Len Blalock, CMA If so, what pressure? unsure unsure at 1106 on 07/29/13 by Len Blalock, CMA Do you wear oxygen at any time? No No at 1106 on 07/29/13 by Len Blalock, CMA   Review of Systems  Constitutional: Positive for fatigue. Negative for fever and unexpected weight change.  HENT: Negative for congestion, dental problem, ear pain, nosebleeds, postnasal drip, rhinorrhea, sinus pressure, sneezing, sore throat and trouble swallowing.   Eyes: Negative for redness and itching.  Respiratory: Negative for cough, chest tightness, shortness of breath and wheezing.   Cardiovascular: Negative for palpitations and leg swelling.  Gastrointestinal: Negative for nausea and vomiting.  Genitourinary: Negative for dysuria.  Musculoskeletal: Negative for joint swelling.  Skin: Negative for rash.  Neurological: Negative for headaches.  Hematological: Does not bruise/bleed easily.  Psychiatric/Behavioral: Negative for dysphoric mood. The patient is not nervous/anxious.  Objective:   Physical Exam Constitutional:  Well developed, no acute distress  HENT:  Nares patent without discharge  Oropharynx without exudate, palate and uvula are elongated and thickened. Eyes:  Perrla, eomi, no scleral icterus  Neck:  No JVD, no TMG  Cardiovascular:  Normal rate, regular rhythm, no rubs or gallops.  No murmurs        Intact distal pulses  Pulmonary :  Normal breath sounds, no stridor or respiratory  distress   No rales, rhonchi, or wheezing  Abdominal:  Soft, nondistended, bowel sounds present.  No tenderness noted.   Musculoskeletal:  No lower extremity edema noted.  Lymph Nodes:  No cervical lymphadenopathy noted  Skin:  No cyanosis noted  Neurologic:  Alert, appropriate, moves all 4 extremities without obvious deficit.         Assessment & Plan:

## 2013-07-29 NOTE — Assessment & Plan Note (Signed)
The patient continues on CPAP, and he feels that his machine is working adequately. He sleeps well with the device, and is having no issues with mask fit or pressure. I have encouraged him to keep up with mask changes and supplies.

## 2013-07-29 NOTE — Assessment & Plan Note (Signed)
The patient is having significant daytime fatigue > true sleepiness. He has a history of an abnormal MS LT, and it was unclear at that time how much of this was due to to idiopathic hypersomnia versus his sleep apnea and inadequate total sleep time. I have treated him with Nuvigil in the past with some success, but the patient feels that his testosterone replacement helped even more. I suspect his main issue is more fatigue than true sleepiness during the day, and I've encouraged him to talk with his primary physician about the risk and benefits of hormone replacement. Perhaps he should get back on treatment if it helped him that much. For now, I will give him a prescription for Nuvigil as needed until he can get in to see his primary physician for advice. I have also stressed to him the importance of getting more than 5-6 hours of sleep if at all possible.

## 2013-09-26 IMAGING — CR DG CHEST 2V
3 series · 3 of 3 positions shown · non-contrast
Comparison: March 10, 2010

CLINICAL DATA: Short of breath

CHEST - 2 VIEW

[PA (1 of 2)]
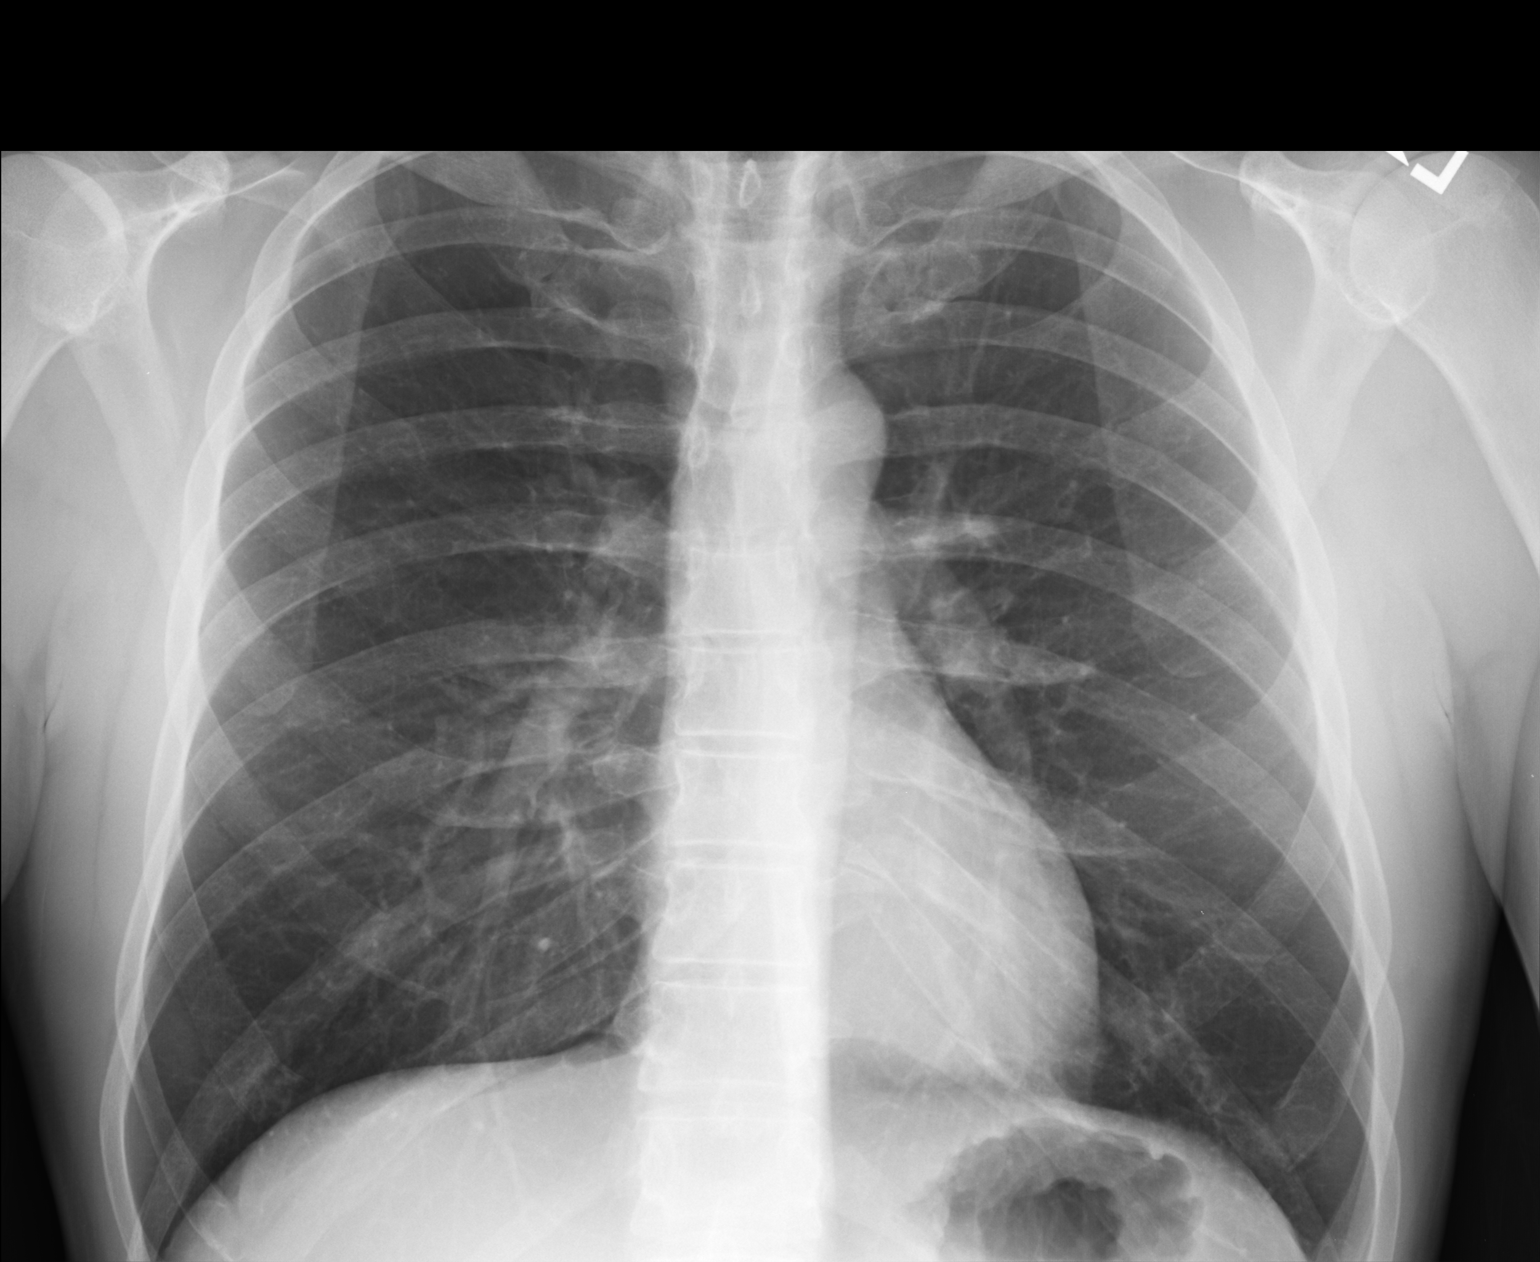

[lateral]
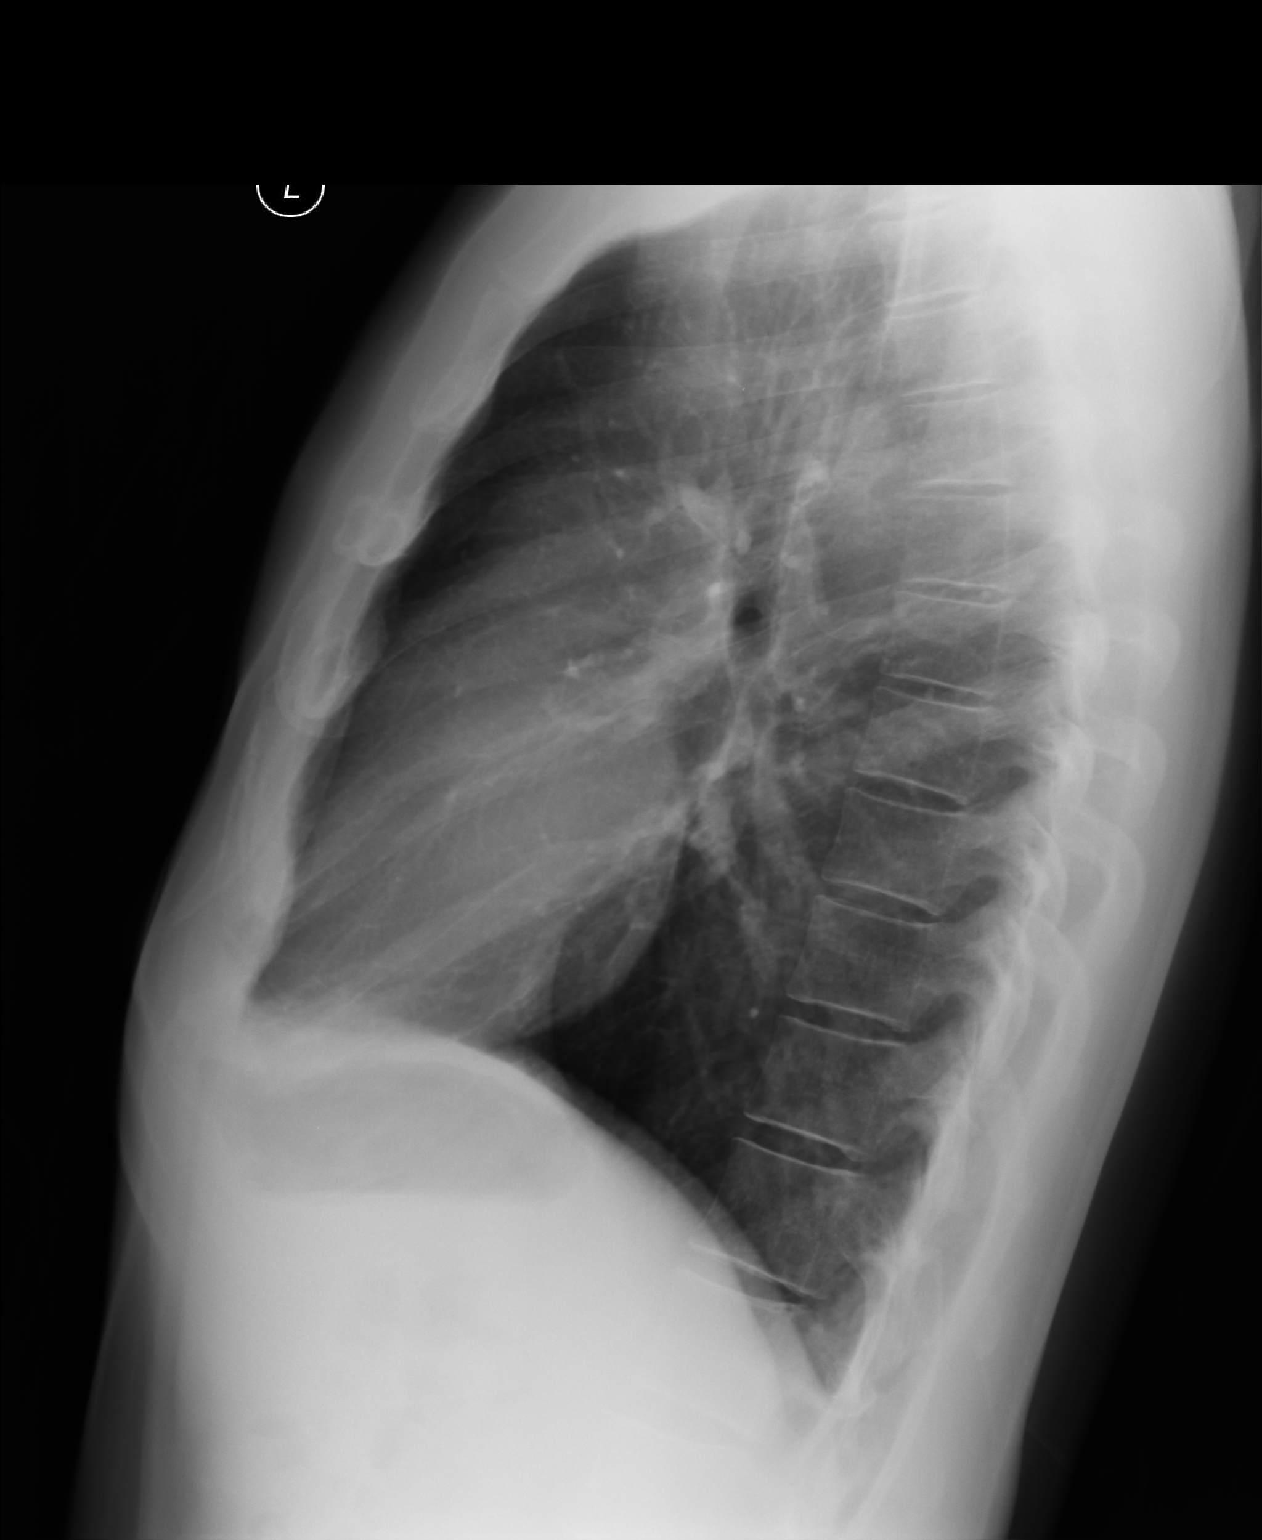

[PA (2 of 2)]
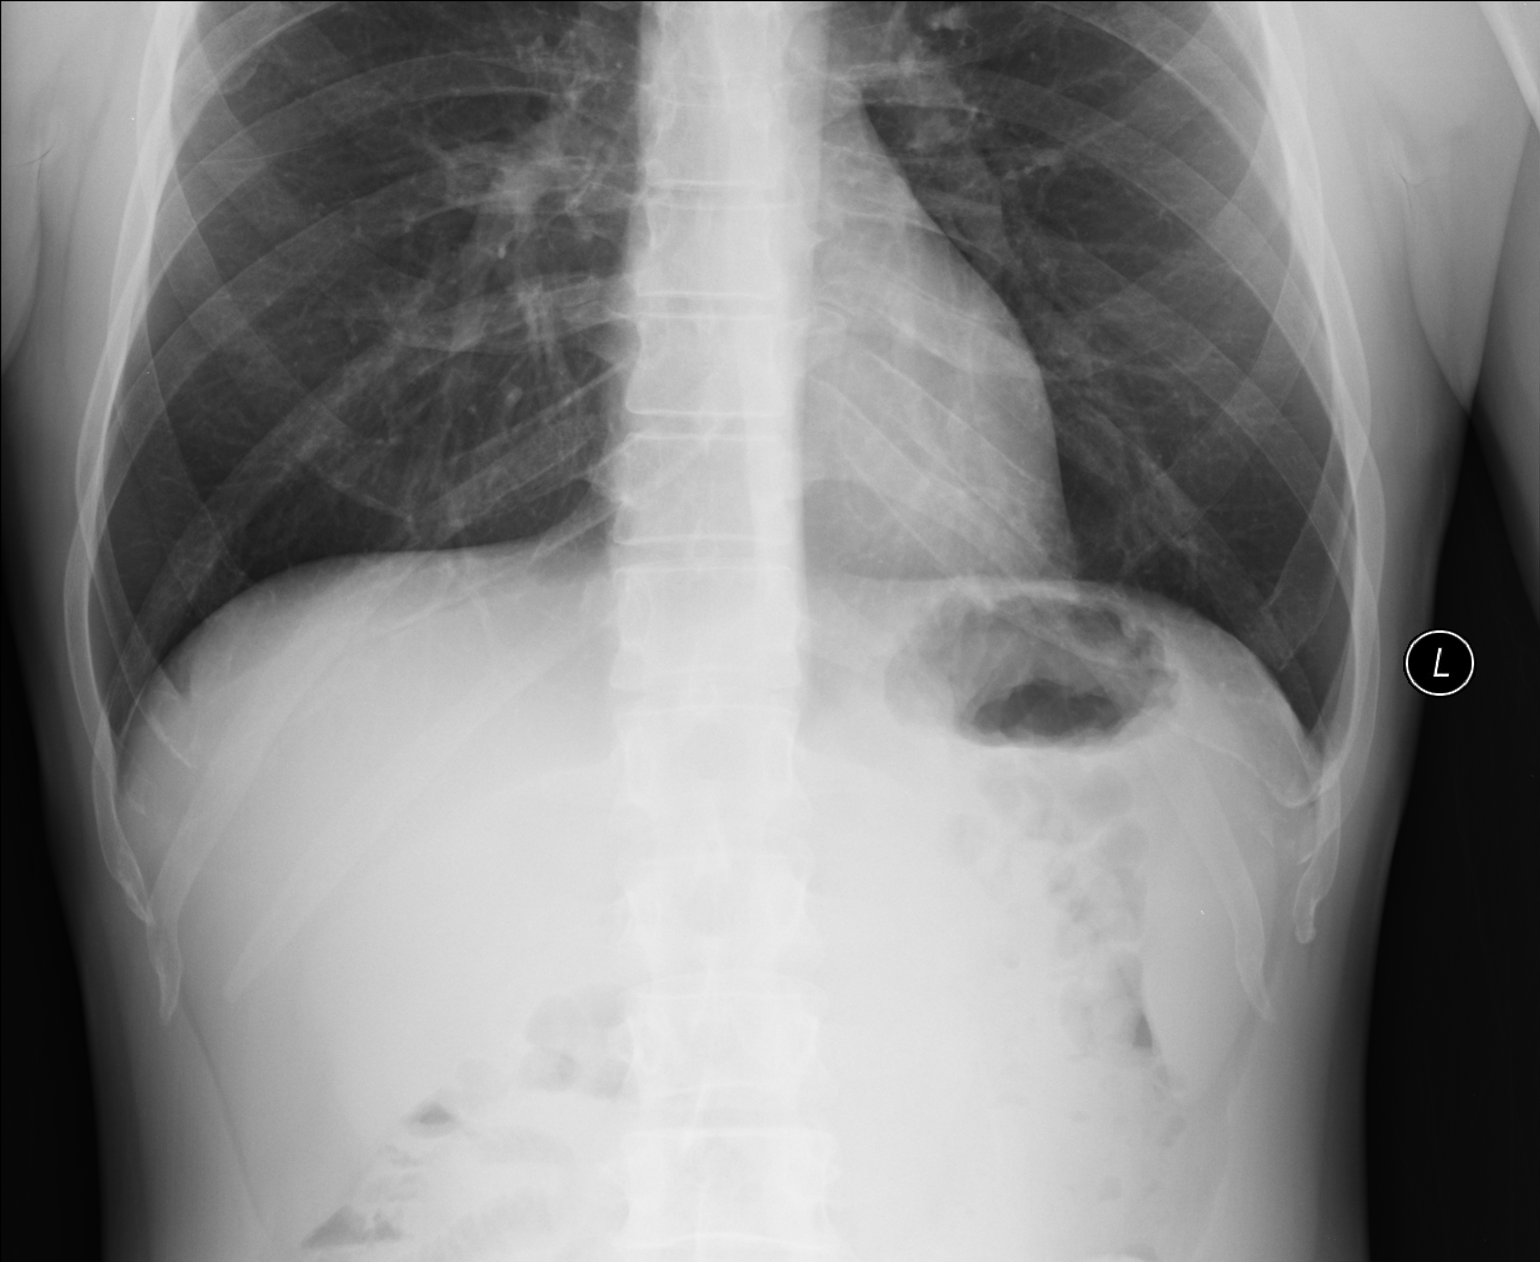

[3 of 3 positions shown; findings below may reference images not displayed]

FINDINGS: There is no focal infiltrate, pulmonary edema, or pleural
effusion.  The mediastinal contour and cardiac silhouette are
normal.  The soft tissue and osseous structures are normal.
IMPRESSION: No acute cardiopulmonary disease identified.

## 2013-11-10 ENCOUNTER — Other Ambulatory Visit: Payer: Self-pay | Admitting: Pulmonary Disease

## 2013-11-15 ENCOUNTER — Other Ambulatory Visit: Payer: Self-pay | Admitting: *Deleted

## 2013-11-15 MED ORDER — ARMODAFINIL 150 MG PO TABS: ORAL_TABLET | ORAL | Status: DC

## 2014-04-25 ENCOUNTER — Other Ambulatory Visit: Payer: Self-pay | Admitting: Pulmonary Disease

## 2014-05-05 ENCOUNTER — Other Ambulatory Visit: Payer: Self-pay | Admitting: Pulmonary Disease

## 2014-05-06 ENCOUNTER — Other Ambulatory Visit: Payer: Self-pay | Admitting: Pulmonary Disease

## 2014-05-07 ENCOUNTER — Other Ambulatory Visit: Payer: Self-pay | Admitting: Pulmonary Disease

## 2014-05-11 ENCOUNTER — Other Ambulatory Visit: Payer: Self-pay | Admitting: Pulmonary Disease

## 2014-05-19 ENCOUNTER — Encounter: Payer: Self-pay | Admitting: Pulmonary Disease

## 2014-05-19 ENCOUNTER — Ambulatory Visit (INDEPENDENT_AMBULATORY_CARE_PROVIDER_SITE_OTHER): Payer: Managed Care, Other (non HMO) | Admitting: Pulmonary Disease

## 2014-05-19 VITALS — BP 130/76 | HR 66 | Temp 97.0°F | Ht 72.0 in | Wt 191.2 lb

## 2014-05-19 DIAGNOSIS — G4733 Obstructive sleep apnea (adult) (pediatric): Secondary | ICD-10-CM

## 2014-05-19 DIAGNOSIS — G4711 Idiopathic hypersomnia with long sleep time: Secondary | ICD-10-CM

## 2014-05-19 MED ORDER — ARMODAFINIL 150 MG PO TABS: ORAL_TABLET | ORAL | Status: DC

## 2014-05-19 NOTE — Progress Notes (Signed)
   Subjective:    Patient ID: Jeremy Guerra, male    DOB: 1960-07-01, 54 y.o.   MRN: [id number]  HPI The patient comes in today for follow-up of his known obstructive sleep apnea, as well as idiopathic hypersomnia. He continues on C Pap compliantly, as well as Nuvigil during the day to help with his sleepiness. Unfortunately, he is only getting about 4-5 hours of sleep a night, and I stressed to him the importance of getting 6-7. His C Pap device is starting to malfunction, and is overdue for a new device.   Review of Systems  Constitutional: Negative for fever and unexpected weight change.  HENT: Negative for congestion, dental problem, ear pain, nosebleeds, postnasal drip, rhinorrhea, sinus pressure, sneezing, sore throat and trouble swallowing.   Eyes: Negative for redness and itching.  Respiratory: Negative for cough, chest tightness, shortness of breath and wheezing.   Cardiovascular: Negative for palpitations and leg swelling.  Gastrointestinal: Negative for nausea and vomiting.  Genitourinary: Negative for dysuria.  Musculoskeletal: Negative for joint swelling.  Skin: Negative for rash.  Neurological: Negative for headaches.  Hematological: Does not bruise/bleed easily.  Psychiatric/Behavioral: Negative for dysphoric mood. The patient is not nervous/anxious.        Objective:   Physical Exam Well-developed male in no acute distress Nose without purulence or discharge noted Neck without lymphadenopathy or thyromegaly No skin breakdown or pressure necrosis from the C Pap mask Lower extremities without edema, no cyanosis Awake, but does appear sleepy, moves all 4 extremities.       Assessment & Plan:

## 2014-05-19 NOTE — Assessment & Plan Note (Signed)
The patient is to continue on Nuvigil for his daytime sleepiness, but I have also stressed to him the importance of trying to get at least 6-7 hours sleep a night.

## 2014-05-19 NOTE — Patient Instructions (Signed)
Will refill your nuvigil with refills Will get you a new cpap machine, and put on auto setting.  Let us know if you are not doing well with the device. Work on getting 6-7 hrs of sleep consistently followup with me again in one year if doing well, but call if having issues.

## 2014-05-19 NOTE — Assessment & Plan Note (Signed)
The patient has been wearing C Pap compliantly, but he is now having difficulties with his device. It is approaching 53 years old, and is due for a replacement. Will send an order for a new device.

## 2014-08-23 ENCOUNTER — Ambulatory Visit (INDEPENDENT_AMBULATORY_CARE_PROVIDER_SITE_OTHER): Payer: Managed Care, Other (non HMO) | Admitting: Family Medicine

## 2014-08-23 ENCOUNTER — Ambulatory Visit (INDEPENDENT_AMBULATORY_CARE_PROVIDER_SITE_OTHER): Payer: Managed Care, Other (non HMO)

## 2014-08-23 VITALS — BP 110/70 | HR 66 | Temp 97.8°F | Resp 16 | Ht 73.0 in | Wt 187.0 lb

## 2014-08-23 DIAGNOSIS — R61 Generalized hyperhidrosis: Secondary | ICD-10-CM

## 2014-08-23 DIAGNOSIS — R05 Cough: Secondary | ICD-10-CM

## 2014-08-23 DIAGNOSIS — R062 Wheezing: Secondary | ICD-10-CM

## 2014-08-23 DIAGNOSIS — R059 Cough, unspecified: Secondary | ICD-10-CM

## 2014-08-23 MED ORDER — ALBUTEROL SULFATE (2.5 MG/3ML) 0.083% IN NEBU
2.5000 mg | INHALATION_SOLUTION | Freq: Once | RESPIRATORY_TRACT | Status: AC
  Administered 2014-08-23: 2.5 mg via RESPIRATORY_TRACT

## 2014-08-23 MED ORDER — PREDNISONE 20 MG PO TABS: ORAL_TABLET | ORAL | Status: DC

## 2014-08-23 MED ORDER — LEVOFLOXACIN 500 MG PO TABS: 500.0000 mg | ORAL_TABLET | Freq: Every day | ORAL | Status: DC

## 2014-08-23 MED ORDER — HYDROCODONE-HOMATROPINE 5-1.5 MG/5ML PO SYRP
5.0000 mL | ORAL_SOLUTION | Freq: Three times a day (TID) | ORAL | Status: DC | PRN
Start: 2014-08-23 — End: 2014-12-29

## 2014-08-23 NOTE — Progress Notes (Signed)
° °  Subjective:    Patient ID: Jeremy Guerra, male    DOB: 30-Sep-1960, 54 y.o.   MRN: [id number] This chart was scribed for Robyn Haber, MD by Zola Button, Medical Scribe. This patient was seen in Room 14 and the patient's care was started at 3:28 PM.   HPI HPI Comments: Jeremy Guerra is a 54 y.o. male with a hx of obstructive sleep apnea who presents to the [facility] complaining of gradual onset URI symptoms that started a week and a half ago. Patient reports having sore throat initially, then developed sinus symptoms, chest congestion, night sweats, wheezing, headache, and fatigue. His sore throat has since resolved. He thinks he may have bronchitis or strep throat. He is still smoking.  Patient works at [employer] He plans to quit when he turns 56.  Review of Systems  Constitutional: Positive for fever and diaphoresis.  HENT: Positive for congestion and rhinorrhea. Negative for sore throat.   Respiratory: Positive for cough and wheezing.   Neurological: Positive for headaches.       Objective:   Physical Exam CONSTITUTIONAL: Well developed/well nourished HEAD: Normocephalic/atraumatic EYES: EOM/PERRL ENMT: Mucous membranes moist NECK: supple no meningeal signs SPINE: entire spine nontender CV: S1/S2 noted, no murmurs/rubs/gallops noted LUNGS: Inspiratory rales and expiratory wheezes bilaterally.  No significant improvement after nebulizer treatment. ABDOMEN: soft, nontender, no rebound or guarding GU: no cva tenderness NEURO: Pt is awake/alert, moves all extremitiesx4 EXTREMITIES: pulses normal, full ROM SKIN: warm, color normal PSYCH: no abnormalities of mood noted  [facility] reading (PRIMARY) by  Dr. Joseph Art:  Hazy right mid lung field, no definite infiltrate.     Assessment & Plan:   This chart was scribed in my presence and reviewed by me personally.    ICD-9-CM ICD-10-CM   1. Cough 786.2 R05 DG Chest 2 View     DG Chest 2 View     albuterol  (PROVENTIL) (2.5 MG/3ML) 0.083% nebulizer solution 2.5 mg     levofloxacin (LEVAQUIN) 500 MG tablet     predniSONE (DELTASONE) 20 MG tablet     HYDROcodone-homatropine (HYCODAN) 5-1.5 MG/5ML syrup  2. Wheezing 786.07 R06.2 DG Chest 2 View     DG Chest 2 View     albuterol (PROVENTIL) (2.5 MG/3ML) 0.083% nebulizer solution 2.5 mg     levofloxacin (LEVAQUIN) 500 MG tablet     predniSONE (DELTASONE) 20 MG tablet     HYDROcodone-homatropine (HYCODAN) 5-1.5 MG/5ML syrup  3. Night sweats 780.8 R61 DG Chest 2 View     DG Chest 2 View     levofloxacin (LEVAQUIN) 500 MG tablet     predniSONE (DELTASONE) 20 MG tablet     HYDROcodone-homatropine (HYCODAN) 5-1.5 MG/5ML syrup    Signed, Robyn Haber, MD

## 2014-12-09 ENCOUNTER — Telehealth: Payer: Self-pay | Admitting: Pulmonary Disease

## 2014-12-09 MED ORDER — ARMODAFINIL 150 MG PO TABS: ORAL_TABLET | ORAL | Status: DC

## 2014-12-09 NOTE — Telephone Encounter (Signed)
Pt called back. He was last seen 05/19/14 by [facility]. Told to f/u in 1 year He is needing refill on nuvigil 150 mg  Last refilled 05/19/14 #30 x 0 refills TAKE ONE TABLET BY MOUTH DAILY AS NEEDED for sleepiness  Please advise Dr. Halford Chessman if okay to refill? thanks

## 2014-12-09 NOTE — Telephone Encounter (Signed)
RX has been signed and faxed to the pharm.  Called pt and made aware. Nothing further needed

## 2014-12-09 NOTE — Telephone Encounter (Signed)
lmomtcb x1 

## 2014-12-09 NOTE — Telephone Encounter (Signed)
He has hx of OSA on CPAP with residual daytime hypersomnolence.  Okay to refill nuvigil 150 mg.  He can take one pill daily prn.  Dispense 30 pills with 5 refills.

## 2014-12-29 ENCOUNTER — Ambulatory Visit (INDEPENDENT_AMBULATORY_CARE_PROVIDER_SITE_OTHER): Payer: Managed Care, Other (non HMO) | Admitting: Family Medicine

## 2014-12-29 VITALS — BP 110/76 | HR 55 | Temp 98.3°F | Resp 16 | Ht 72.0 in | Wt 187.6 lb

## 2014-12-29 DIAGNOSIS — R059 Cough, unspecified: Secondary | ICD-10-CM

## 2014-12-29 DIAGNOSIS — R05 Cough: Secondary | ICD-10-CM

## 2014-12-29 DIAGNOSIS — R61 Generalized hyperhidrosis: Secondary | ICD-10-CM

## 2014-12-29 DIAGNOSIS — R062 Wheezing: Secondary | ICD-10-CM

## 2014-12-29 DIAGNOSIS — D509 Iron deficiency anemia, unspecified: Secondary | ICD-10-CM

## 2014-12-29 LAB — POCT CBC
Granulocyte percent: 68.3 %G (ref 37–80)
HCT, POC: 41.4 % — AB (ref 43.5–53.7)
Hemoglobin: 13.6 g/dL — AB (ref 14.1–18.1)
Lymph, poc: 1.1 (ref 0.6–3.4)
MCH, POC: 30.5 pg (ref 27–31.2)
MCHC: 32.9 g/dL (ref 31.8–35.4)
MCV: 92.8 fL (ref 80–97)
MID (cbc): 0.5 (ref 0–0.9)
MPV: 8.8 fL (ref 0–99.8)
POC Granulocyte: 3.6 (ref 2–6.9)
POC LYMPH PERCENT: 21.6 %L (ref 10–50)
POC MID %: 10.1 %M (ref 0–12)
Platelet Count, POC: 117 10*3/uL — AB (ref 142–424)
RBC: 4.46 M/uL — AB (ref 4.69–6.13)
RDW, POC: 14.3 %
WBC: 5.2 10*3/uL (ref 4.6–10.2)

## 2014-12-29 MED ORDER — PREDNISONE 20 MG PO TABS: ORAL_TABLET | ORAL | Status: DC

## 2014-12-29 MED ORDER — HYDROCODONE-HOMATROPINE 5-1.5 MG/5ML PO SYRP: 5.0000 mL | ORAL_SOLUTION | Freq: Three times a day (TID) | ORAL | Status: DC | PRN

## 2014-12-29 NOTE — Progress Notes (Signed)
[facility] [address] [place] [address] [phone]  Date:  12/29/2014   Name:  Jeremy Guerra   DOB:  Aug 07, 1960   MRN:  [id number]  PCP:  Robyn Haber, MD    Chief Complaint: Cough; lung congestion; Headache; Dizziness; Night Sweats; and Fever   History of Present Illness:  Jeremy Guerra is a 54 y.o. very pleasant male patient who presents with the following:  Here today with illness.  He has noted chest congestion, painful cough, headaches, feeling lightheaded, when he stands up too fast he will feel dizzy.  A few months ago he had pneumonia- this does "sort of" remind him of this time-However he was sicker last time because he let it go longer. This time he has been sick for 4-5 days He has felt tired and drained He has felt feverish at night, he has noted subjective fever and chills at night only.  Has not measured a fever Poor appetite but no vomiting No ST, ears are congested.   Some people have been ill with a cough at work He has tried some nyqyuil last night  He does not have history of asthma or RAD  BP Readings from Last 3 Encounters:  12/29/14 110/76  08/23/14 110/70  05/19/14 130/76   Pulse Readings from Last 3 Encounters:  12/29/14 55  08/23/14 66  05/19/14 66     Patient Active Problem List   Diagnosis Date Noted  . Idiopathic hypersomnia 07/29/2013  . Obstructive sleep apnea 03/28/2007    Past Medical History  Diagnosis Date  . Hypogonadism male   . Hypercholesteremia   . Chronic headaches   . Sleep apnea     Past Surgical History  Procedure Laterality Date  . Spine surgery  1994    Social History  Substance Use Topics  . Smoking status: Heavy Tobacco Smoker -- 0.50 packs/day for 39 years    Types: Cigarettes  . Smokeless tobacco: Never Used     Comment: Also uses e-cig   . Alcohol Use: No    Family History  Problem Relation Age of Onset  . Hypertension Father   . Diabetes Father     No Known  Allergies  Medication list has been reviewed and updated.  Current Outpatient Prescriptions on File Prior to Visit  Medication Sig Dispense Refill  . Armodafinil (NUVIGIL) 150 MG tablet TAKE ONE TABLET BY MOUTH DAILY AS NEEDED for sleepiness 30 tablet 5   No current facility-administered medications on file prior to visit.    Review of Systems:  As per HPI- otherwise negative.   Physical Examination: Filed Vitals:   12/29/14 1316  BP: 110/76  Pulse: 55  Temp: 98.3 F (36.8 C)  Resp: 16   Filed Vitals:   12/29/14 1316  Height: 6' (1.829 m)  Weight: 187 lb 9.6 oz (85.095 kg)   Body mass index is 25.44 kg/(m^2). Ideal Body Weight: Weight in (lb) to have BMI = 25: 183.9  GEN: WDWN, NAD, Non-toxic, A & O x 3, normal weight, looks well HEENT: Atraumatic, Normocephalic. Neck supple. No masses, No LAD.  Bilateral TM wnl, oropharynx normal.  PEERL,EOMI.   Ears and Nose: No external deformity. CV: RRR, No M/G/R. No JVD. No thrill. No extra heart sounds. PULM: CTA B, no wheezing, crackles, rhonchi. No retractions. No resp. distress. No accessory muscle use. Not wheezing at time of exam EXTR: No c/c/e NEURO Normal gait.  PSYCH: Normally interactive. Conversant. Not depressed or anxious  appearing.  Calm demeanor.   Results for orders placed or performed in visit on 12/29/14  POCT CBC  Result Value Ref Range   WBC 5.2 4.6 - 10.2 K/uL   Lymph, poc 1.1 0.6 - 3.4   POC LYMPH PERCENT 21.6 10 - 50 %L   MID (cbc) 0.5 0 - 0.9   POC MID % 10.1 0 - 12 %M   POC Granulocyte 3.6 2 - 6.9   Granulocyte percent 68.3 37 - 80 %G   RBC 4.46 (A) 4.69 - 6.13 M/uL   Hemoglobin 13.6 (A) 14.1 - 18.1 g/dL   HCT, POC 41.4 (A) 43.5 - 53.7 %   MCV 92.8 80 - 97 fL   MCH, POC 30.5 27 - 31.2 pg   MCHC 32.9 31.8 - 35.4 g/dL   RDW, POC 14.3 %   Platelet Count, POC 117 (A) 142 - 424 K/uL   MPV 8.8 0 - 99.8 fL   Assessment and Plan: Cough - Plan: POCT CBC, predniSONE (DELTASONE) 20 MG tablet,  HYDROcodone-homatropine (HYCODAN) 5-1.5 MG/5ML syrup  Wheezing - Plan: predniSONE (DELTASONE) 20 MG tablet, HYDROcodone-homatropine (HYCODAN) 5-1.5 MG/5ML syrup  Night sweats - Plan: predniSONE (DELTASONE) 20 MG tablet, HYDROcodone-homatropine (HYCODAN) 5-1.5 MG/5ML syrup  Iron deficiency anemia - Plan: CBC  Likely viral illness. Treat with prednisone and cough syrup as needed- he will let me know if not getting better Noted anemia-this was not present in the past.  He has never had a colonoscopy.  Encouraged him to get this done and to schedule a CPE soon; also ordered a repeat CBC for one month from now  [place], MD

## 2014-12-29 NOTE — Patient Instructions (Addendum)
It appears that you have a viral illness Use the prednisone as directed, and the cough syrup as needed Let us know if you do not feel better in the next few days If you are getting worse let me know right away!  Your blood pressure and pulse rate are on the low side-  for the next few days try to increase your fluid intake and don't be afraid to eat some saltysnacks   Please come by for a repeat blood count in about one month

## 2015-01-16 ENCOUNTER — Telehealth: Payer: Self-pay | Admitting: Pulmonary Disease

## 2015-01-16 NOTE — Telephone Encounter (Signed)
Pt returning call.Jeremy Guerra ° °

## 2015-01-16 NOTE — Telephone Encounter (Signed)
lmtcb x2 for pt. 

## 2015-01-16 NOTE — Telephone Encounter (Signed)
lmtcb

## 2015-01-30 ENCOUNTER — Telehealth: Payer: Self-pay | Admitting: Pulmonary Disease

## 2015-01-30 NOTE — Telephone Encounter (Signed)
Spoke with pharmacy, nuvigil refills on file at pharmacy.  Nothing further needed.

## 2015-01-30 NOTE — Telephone Encounter (Signed)
Pharm calling about this refill please advise they csn be reached @ [phone].Jeremy Guerra

## 2015-02-19 ENCOUNTER — Encounter: Payer: Self-pay | Admitting: Pulmonary Disease

## 2015-05-20 ENCOUNTER — Ambulatory Visit: Payer: Managed Care, Other (non HMO) | Admitting: Pulmonary Disease

## 2015-05-28 ENCOUNTER — Encounter: Payer: Self-pay | Admitting: Internal Medicine

## 2015-05-28 ENCOUNTER — Ambulatory Visit (INDEPENDENT_AMBULATORY_CARE_PROVIDER_SITE_OTHER): Payer: Managed Care, Other (non HMO) | Admitting: Internal Medicine

## 2015-05-28 VITALS — BP 120/66 | HR 62

## 2015-05-28 DIAGNOSIS — G4733 Obstructive sleep apnea (adult) (pediatric): Secondary | ICD-10-CM | POA: Diagnosis not present

## 2015-05-28 DIAGNOSIS — G4711 Idiopathic hypersomnia with long sleep time: Secondary | ICD-10-CM

## 2015-05-28 MED ORDER — ARMODAFINIL 250 MG PO TABS: 250.0000 mg | ORAL_TABLET | Freq: Every day | ORAL | Status: DC

## 2015-05-28 NOTE — Patient Instructions (Signed)
Please try hard to stop smoking- you can't un-smoke them, and they add up.  Order- DME [facility] Romans change Auto CPAP range to 5-12     Dx OSA  Script increasing Nuvigil to 250 mg once daily

## 2015-05-28 NOTE — Assessment & Plan Note (Signed)
Download confirms good compliance. He would like to reduce the pressure range are little. Control is good. Plan-reduce auto range to 5-12/[facility]

## 2015-05-28 NOTE — Assessment & Plan Note (Signed)
He is interested in trying something stronger. We discussed new visual versus alternatives, good sleep habits and lifestyle. He thinks this will be better when he is able to retire. Plan-try Nuvigil 250 mg

## 2015-05-28 NOTE — Progress Notes (Signed)
Subjective:    Patient ID: Jeremy Guerra, male    DOB: [birth year], 55 y.o.   MRN: [id number]  HPI 05/19/14- Dr Gwenette Greet The patient comes in today for follow-up of his known obstructive sleep apnea, as well as idiopathic hypersomnia. He continues on C Pap compliantly, as well as Nuvigil during the day to help with his sleepiness. Unfortunately, he is only getting about 4-5 hours of sleep a night, and I stressed to him the importance of getting 6-7. His C Pap device is starting to malfunction, and is overdue for a new device.  05/28/2015-55 year old male smoker Former [facility] patient followed for OSA, idiopathic hypersomnia; DME is [facility]-wears CPAP every night for about 4.5 hours or more. NPSG- 2007, AHI 12 per hour CPAP [employer]- auto 5-15. Pressure sometimes is a little too high Nuvigil 150 mg is not holding well enough. He falls asleep to easily at work especially during meetings. He expects this problem and his smoking habit to be much better when he retires in the next couple of years. We discussed alternative dosing of Nuvigil.  ROS-see HPI   Negative unless "+" Constitutional:    weight loss, night sweats, fevers, chills, fatigue, lassitudeHEENT:    headaches, difficulty swallowing, tooth/dental problems, sore throat,       sneezing, itching, ear ache, nasal congestion, post nasal drip, snoring CV:    chest pain, orthopnea, PND, swelling in lower extremities, anasarca,                                                    dizziness, palpitations Resp:   shortness of breath with exertion or at rest.                productive cough,   non-productive cough, coughing up of blood.              change in color of mucus.  wheezing.   Skin:    rash or lesions. GI:  No-   heartburn, indigestion, abdominal pain, nausea, vomiting, diarrhea,                 change in bowel habits, loss of appetite GU: dysuria, change in color of urine, no urgency or frequency.   flank pain. MS:   joint pain, stiffness, decreased  range of motion, back pain. Neuro-     nothing unusual Psych:  change in mood or affect.  depression or anxiety.   memory loss.    Objective:  OBJ- Physical Exam General- Alert, Oriented, Affect-appropriate, Distress- none acute, not obese Skin- rash-none, lesions- none, excoriation- none Lymphadenopathy- none Head- atraumatic            Eyes- Gross vision intact, PERRLA, conjunctivae and secretions clear            Ears- Hearing, canals-normal            Nose- Clear, no-Septal dev, mucus, polyps, erosion, perforation             Throat- Mallampati IV , mucosa clear , drainage- none, tonsils- atrophic Neck- flexible , trachea midline, no stridor , thyroid nl, carotid no bruit Chest - symmetrical excursion , unlabored           Heart/CV- RRR , no murmur , no gallop  , no rub, nl s1 s2                           -  JVD- none , edema- none, stasis changes- none, varices- none           Lung- clear to P&A, wheeze- none, cough- none , dullness-none, rub- none           Chest wall-  Abd-  Br/ Gen/ Rectal- Not done, not indicated Extrem- cyanosis- none, clubbing, none, atrophy- none, strength- nl Neuro- grossly intact to observation    Assessment & Plan:

## 2015-06-17 ENCOUNTER — Encounter: Payer: Managed Care, Other (non HMO) | Admitting: Urgent Care

## 2015-07-16 ENCOUNTER — Ambulatory Visit
Admission: EM | Admit: 2015-07-16 | Discharge: 2015-07-16 | Disposition: A | Discharge status: Home / Self Care | Payer: Managed Care, Other (non HMO) | Attending: Family Medicine | Admitting: Family Medicine

## 2015-07-16 ENCOUNTER — Encounter: Payer: Self-pay | Admitting: *Deleted

## 2015-07-16 ENCOUNTER — Ambulatory Visit (INDEPENDENT_AMBULATORY_CARE_PROVIDER_SITE_OTHER)
Admit: 2015-07-16 | Discharge: 2015-07-16 | Disposition: A | Discharge status: Home / Self Care | Payer: Managed Care, Other (non HMO) | Attending: Family Medicine | Admitting: Family Medicine

## 2015-07-16 DIAGNOSIS — S0990XA Unspecified injury of head, initial encounter: Secondary | ICD-10-CM | POA: Diagnosis not present

## 2015-07-16 DIAGNOSIS — M6248 Contracture of muscle, other site: Secondary | ICD-10-CM | POA: Diagnosis not present

## 2015-07-16 DIAGNOSIS — M62838 Other muscle spasm: Secondary | ICD-10-CM

## 2015-07-16 DIAGNOSIS — M47812 Spondylosis without myelopathy or radiculopathy, cervical region: Secondary | ICD-10-CM

## 2015-07-16 DIAGNOSIS — S199XXA Unspecified injury of neck, initial encounter: Secondary | ICD-10-CM | POA: Diagnosis not present

## 2015-07-16 DIAGNOSIS — R51 Headache: Secondary | ICD-10-CM

## 2015-07-16 DIAGNOSIS — R519 Headache, unspecified: Secondary | ICD-10-CM
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
## 2015-07-16 NOTE — Discharge Instructions (Signed)
Cervical Sprain A cervical sprain is when the tissues (ligaments) that hold the neck bones in place stretch or tear. HOME CARE   Put ice on the injured area.  Put ice in a plastic bag.  Place a towel between your skin and the bag.  Leave the ice on for 15-20 minutes, 3-4 times a day.  You may have been given a collar to wear. This collar keeps your neck from moving while you heal.  Do not take the collar off unless told by your doctor.  If you have long hair, keep it outside of the collar.  Ask your doctor before changing the position of your collar. You may need to change its position over time to make it more comfortable.  If you are allowed to take off the collar for cleaning or bathing, follow your doctor's instructions on how to do it safely.  Keep your collar clean by wiping it with mild soap and water. Dry it completely. If the collar has removable pads, remove them every 1-2 days to hand wash them with soap and water. Allow them to air dry. They should be dry before you wear them in the collar.  Do not drive while wearing the collar.  Only take medicine as told by your doctor.  Keep all doctor visits as told.  Keep all physical therapy visits as told.  Adjust your work station so that you have good posture while you work.  Avoid positions and activities that make your problems worse.  Warm up and stretch before being active. GET HELP IF:  Your pain is not controlled with medicine.  You cannot take less pain medicine over time as planned.  Your activity level does not improve as expected. GET HELP RIGHT AWAY IF:   You are bleeding.  Your stomach is upset.  You have an allergic reaction to your medicine.  You develop new problems that you cannot explain.  You lose feeling (become numb) or you cannot move any part of your body (paralysis).  You have tingling or weakness in any part of your body.  Your symptoms get worse. Symptoms include:  Pain,  soreness, stiffness, puffiness (swelling), or a burning feeling in your neck.  Pain when your neck is touched.  Shoulder or upper back pain.  Limited ability to move your neck.  Headache.  Dizziness.  Your hands or arms feel week, lose feeling, or tingle.  Muscle spasms.  Difficulty swallowing or chewing. MAKE SURE YOU:   Understand these instructions.  Will watch your condition.  Will get help right away if you are not doing well or get worse.   This information is not intended to replace advice given to you by your health care provider. Make sure you discuss any questions you have with your health care provider.   Document Released: 09/14/2007 Document Revised: 11/28/2012 Document Reviewed: 10/03/2012 Elsevier Interactive Patient Education 2016 [place], Adult A concussion is a brain injury. It is caused by:  A hit to the head.  A quick and sudden movement (jolt) of the head or neck. A concussion is usually not life threatening. Even so, it can cause serious problems. If you had a concussion before, you may have concussion-like problems after a hit to your head. HOME CARE General Instructions  Follow your doctor's directions carefully.  Take medicines only as told by your doctor.  Only take medicines your doctor says are safe.  Do not drink alcohol until your doctor says it  is okay. Alcohol and some drugs can slow down healing. They can also put you at risk for further injury.  If you are having trouble remembering things, write them down.  Try to do one thing at a time if you get distracted easily. For example, do not watch TV while making dinner.  Talk to your family members or close friends when making important decisions.  Follow up with your doctor as told.  Watch your symptoms. Tell others to do the same. Serious problems can sometimes happen after a concussion. Older adults are more likely to have these problems.  Tell your  teachers, school nurse, school counselor, coach, Product/process development scientist, or work Freight forwarder about your concussion. Tell them about what you can or cannot do. They should watch to see if:  It gets even harder for you to pay attention or concentrate.  It gets even harder for you to remember things or learn new things.  You need more time than normal to finish things.  You become annoyed (irritable) more than before.  You are not able to deal with stress as well.  You have more problems than before.  Rest. Make sure you:  Get plenty of sleep at night.  Go to sleep early.  Go to bed at the same time every day. Try to wake up at the same time.  Rest during the day.  Take naps when you feel tired.  Limit activities where you have to think a lot or concentrate. These include:  Doing homework.  Doing work related to a job.  Watching TV.  Using the computer. Returning To Your Regular Activities Return to your normal activities slowly, not all at once. You must give your body and brain enough time to heal.   Do not play sports or do other athletic activities until your doctor says it is okay.  Ask your doctor when you can drive, ride a bicycle, or work other vehicles or machines. Never do these things if you feel dizzy.  Ask your doctor about when you can return to work or school. Preventing Another Concussion It is very important to avoid another brain injury, especially before you have healed. In rare cases, another injury can lead to permanent brain damage, brain swelling, or death. The risk of this is greatest during the first 7-10 days after your injury. Avoid injuries by:   Wearing a seat belt when riding in a car.  Not drinking too much alcohol.  Avoiding activities that could lead to a second concussion (such as contact sports).  Wearing a helmet when doing activities like:  Biking.  Skiing.  Skateboarding.  Skating.  Making your home safer by:  Removing things  from the floor or stairways that could make you trip.  Using grab bars in bathrooms and handrails by stairs.  Placing non-slip mats on floors and in bathtubs.  Improve lighting in dark areas. GET HELP IF:  It gets even harder for you to pay attention or concentrate.  It gets even harder for you to remember things or learn new things.  You need more time than normal to finish things.  You become annoyed (irritable) more than before.  You are not able to deal with stress as well.  You have more problems than before.  You have problems keeping your balance.  You are not able to react quickly when you should. Get help if you have any of these problems for more than 2 weeks:   Lasting (chronic) headaches.  Dizziness or trouble balancing.  Feeling sick to your stomach (nausea).  Seeing (vision) problems.  Being affected by noises or light more than normal.  Feeling sad, low, down in the dumps, blue, gloomy, or empty (depressed).  Mood changes (mood swings).  Feeling of fear or nervousness about what may happen (anxiety).  Feeling annoyed.  Memory problems.  Problems concentrating or paying attention.  Sleep problems.  Feeling tired all the time. GET HELP RIGHT AWAY IF:   You have bad headaches or your headaches get worse.  You have weakness (even if it is in one hand, leg, or part of the face).  You have loss of feeling (numbness).  You feel off balance.  You keep throwing up (vomiting).  You feel tired.  One black center of your eye (pupil) is larger than the other.  You twitch or shake violently (convulse).  Your speech is not clear (slurred).  You are more confused, easily angered (agitated), or annoyed than before.  You have more trouble resting than before.  You are unable to recognize people or places.  You have neck pain.  It is difficult to wake you up.  You have unusual behavior changes.  You pass out (lose consciousness). MAKE  SURE YOU:   Understand these instructions.  Will watch your condition.  Will get help right away if you are not doing well or get worse.   This information is not intended to replace advice given to you by your health care provider. Make sure you discuss any questions you have with your health care provider.   Document Released: 03/16/2009 Document Revised: 04/18/2014 Document Reviewed: 10/18/2012 Elsevier Interactive Patient Education 2016 Else[employer]  Cryotherapy Cryotherapy is when you put ice on your injury. Ice helps lessen pain and puffiness (swelling) after an injury. Ice works the best when you start using it in the first 24 to 48 hours after an injury. HOME CARE  Put a dry or damp towel between the ice pack and your skin.  You may press gently on the ice pack.  Leave the ice on for no more than 10 to 20 minutes at a time.  Check your skin after 5 minutes to make sure your skin is okay.  Rest at least 20 minutes between ice pack uses.  Stop using ice when your skin loses feeling (numbness).  Do not use ice on someone who cannot tell you when it hurts. This includes small children and people with memory problems (dementia). GET HELP RIGHT AWAY IF:  You have white spots on your skin.  Your skin turns blue or pale.  Your skin feels waxy or hard.  Your puffiness gets worse. MAKE SURE YOU:   Understand these instructions.  Will watch your condition.  Will get help right away if you are not doing well or get worse.   This information is not intended to replace advice given to you by your health care provider. Make sure you discuss any questions you have with your health care provider.   Document Released: 09/14/2007 Document Revised: 06/20/2011 Document Reviewed: 11/18/2010 Elsevier Interactive Patient Education 2016 [employer].  Technical brewer After a car crash (motor vehicle collision), it is normal to have bruises and sore muscles. The first 24  hours usually feel the worst. After that, you will likely start to feel better each day. HOME CARE  Put ice on the injured area.  Put ice in a plastic bag.  Place a towel between your skin  and the bag.  Leave the ice on for 15-20 minutes, 03-04 times a day.  Drink enough fluids to keep your pee (urine) clear or pale yellow.  Do not drink alcohol.  Take a warm shower or bath 1 or 2 times a day. This helps your sore muscles.  Return to activities as told by your doctor. Be careful when lifting. Lifting can make neck or back pain worse.  Only take medicine as told by your doctor. Do not use aspirin. GET HELP RIGHT AWAY IF:   Your arms or legs tingle, feel weak, or lose feeling (numbness).  You have headaches that do not get better with medicine.  You have neck pain, especially in the middle of the back of your neck.  You cannot control when you pee (urinate) or poop (bowel movement).  Pain is getting worse in any part of your body.  You are short of breath, dizzy, or pass out (faint).  You have chest pain.  You feel sick to your stomach (nauseous), throw up (vomit), or sweat.  You have belly (abdominal) pain that gets worse.  There is blood in your pee, poop, or throw up.  You have pain in your shoulder (shoulder strap areas).  Your problems are getting worse. MAKE SURE YOU:   Understand these instructions.  Will watch your condition.  Will get help right away if you are not doing well or get worse.   This information is not intended to replace advice given to you by your health care provider. Make sure you discuss any questions you have with your health care provider.   Document Released: 09/14/2007 Document Revised: 06/20/2011 Document Reviewed: 08/25/2010 Elsevier Interactive Patient Education [employer].
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
## 2015-12-22 ENCOUNTER — Other Ambulatory Visit: Payer: Self-pay | Admitting: Internal Medicine

## 2015-12-22 NOTE — Telephone Encounter (Signed)
Okay to refill 6 months 

## 2015-12-22 NOTE — Telephone Encounter (Signed)
CY Please advise if okay to refill. Thanks.  

## 2016-01-14 ENCOUNTER — Encounter: Payer: Self-pay | Admitting: Family Medicine

## 2016-04-26 IMAGING — CR DG CHEST 2V
2 series · 2 of 2 positions shown · non-contrast
Comparison: 01/23/2012

CLINICAL DATA: Sore throat and sinus congestion, chest congestion,
night sweats, wheezing, headache and fatigue.

EXAM:
CHEST  2 VIEW

[PA]
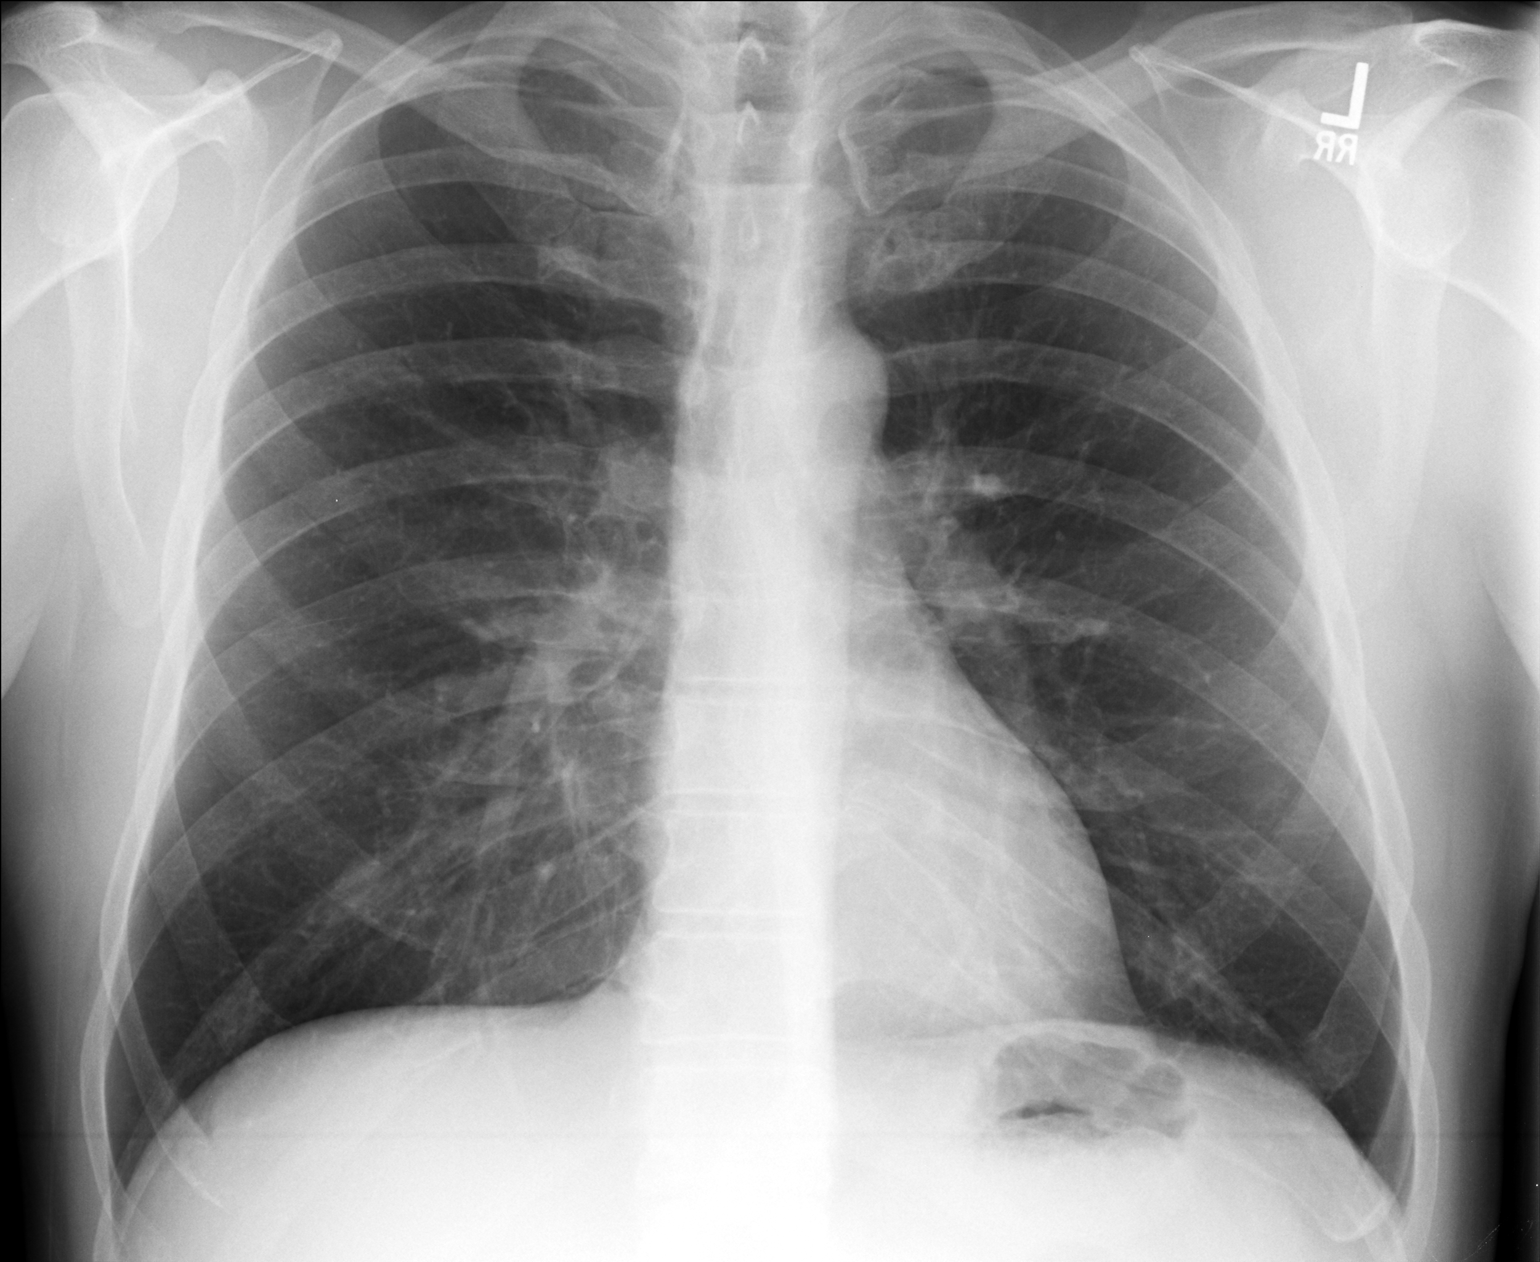

[lateral]
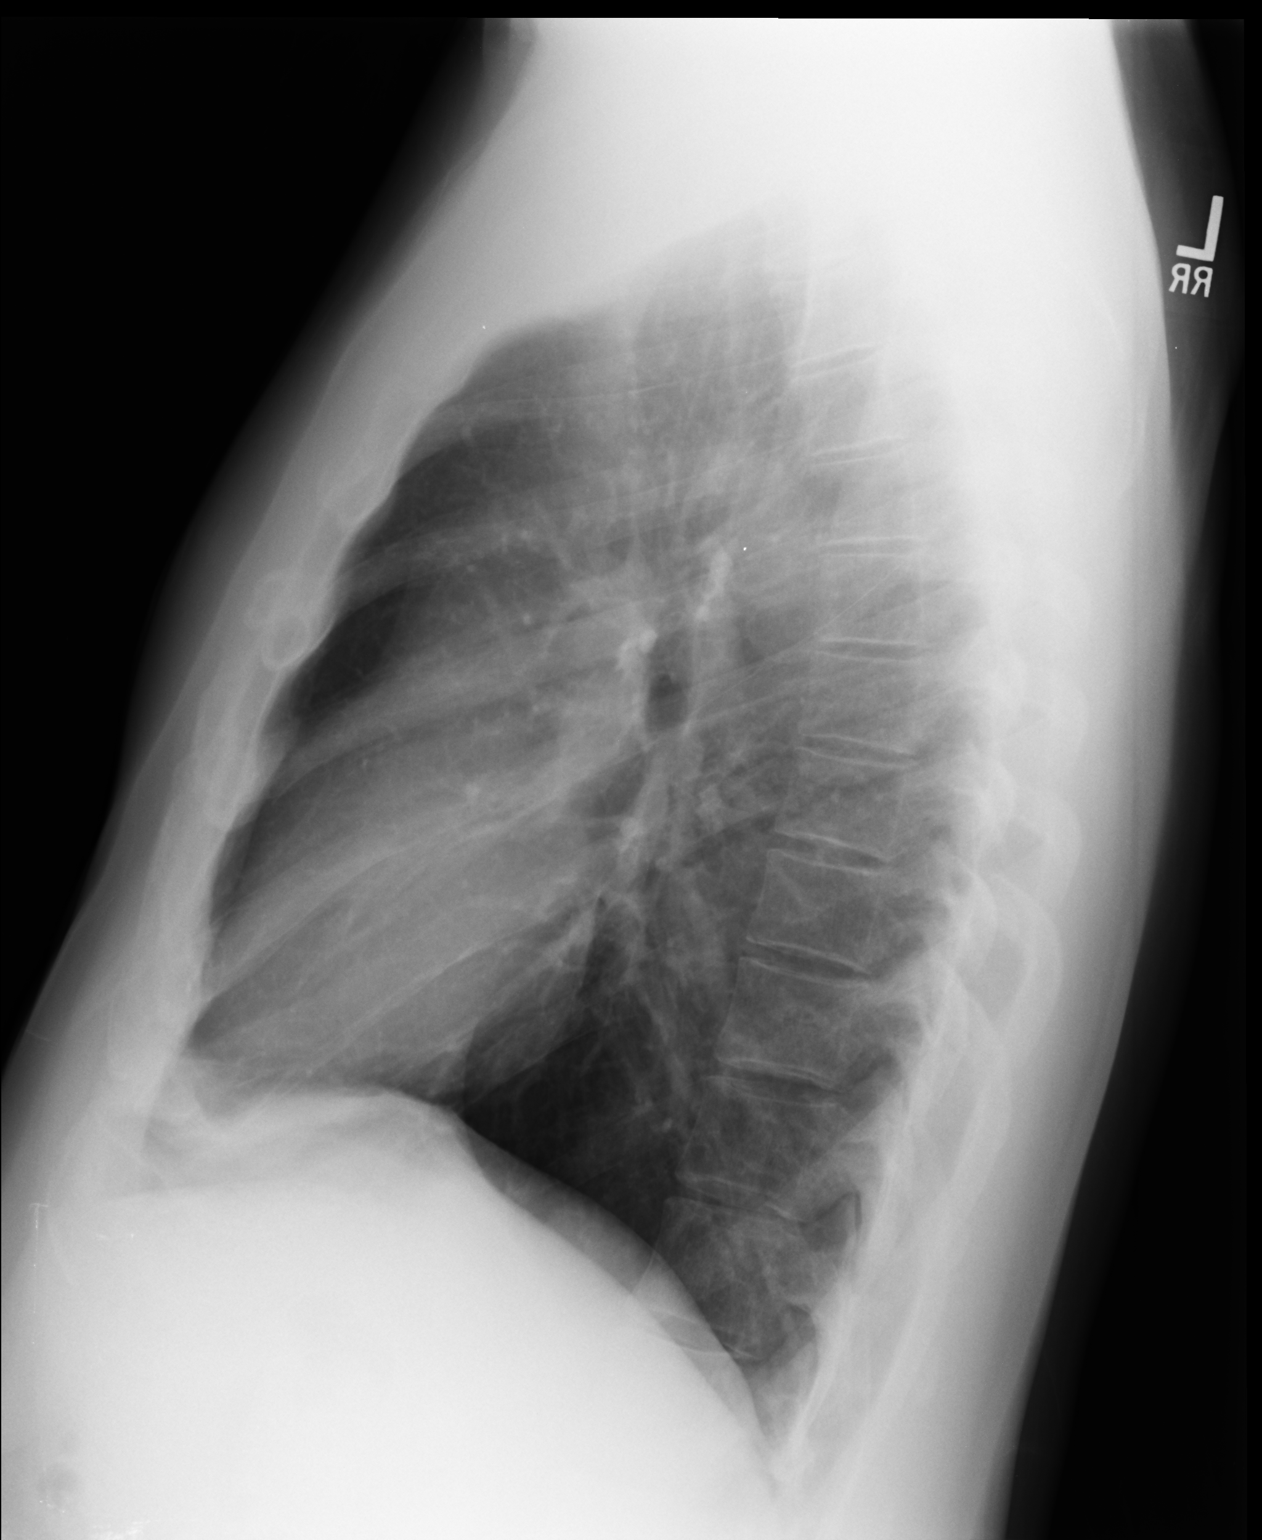

[2 of 2 positions shown; findings below may reference images not displayed]

FINDINGS: The heart size and mediastinal contours are within normal limits.
Both lungs are clear. The visualized skeletal structures are
unremarkable.
IMPRESSION: No active cardiopulmonary disease.

## 2016-05-24 ENCOUNTER — Telehealth: Payer: Self-pay | Admitting: Internal Medicine

## 2016-05-24 MED ORDER — ARMODAFINIL 250 MG PO TABS: 250.0000 mg | ORAL_TABLET | Freq: Every day | ORAL | 1 refills | Status: DC

## 2016-05-24 NOTE — Telephone Encounter (Signed)
Spoke with pt. He is needing a refill on Provigil. Rx has been called in to last until his appointment with CY on 07/04/16. Nothing further was needed.

## 2016-05-27 ENCOUNTER — Ambulatory Visit: Payer: Managed Care, Other (non HMO) | Admitting: Internal Medicine

## 2016-07-04 ENCOUNTER — Encounter: Payer: Self-pay | Admitting: Internal Medicine

## 2016-07-04 ENCOUNTER — Ambulatory Visit (INDEPENDENT_AMBULATORY_CARE_PROVIDER_SITE_OTHER): Payer: Commercial Managed Care - PPO | Admitting: Internal Medicine

## 2016-07-04 VITALS — BP 120/74 | HR 56 | Ht 72.0 in | Wt 185.6 lb

## 2016-07-04 DIAGNOSIS — Z720 Tobacco use: Secondary | ICD-10-CM | POA: Diagnosis not present

## 2016-07-04 DIAGNOSIS — G4733 Obstructive sleep apnea (adult) (pediatric): Secondary | ICD-10-CM | POA: Diagnosis not present

## 2016-07-04 DIAGNOSIS — G4711 Idiopathic hypersomnia with long sleep time: Secondary | ICD-10-CM

## 2016-07-04 NOTE — Patient Instructions (Signed)
Order- DME [facility]- continue CPAP auto 5-15, mask of chice, humidifier, supplies, AirView  Dx OSA                   Needs new supplies now please  You could check with [employer] and check on-line (eg ConsumerMenu[employer]fi) to look into small travel CPAP machines if you are interested.  Suggest you try adding otc Flonase/ fluticasone nasal spray   1-2 puffs each nostril once every day at bedtime while needed during pollen season.  Your upcoming retirement would be a great milestone to celebrate by stopping smoking !  Please call if we can help

## 2016-07-04 NOTE — Progress Notes (Signed)
Subjective:    Patient ID: Jeremy Guerra, male    DOB: July 09, 1960, 56 y.o.   MRN: [id number]  HPI  male smoker  followed for OSA, idiopathic hypersomnia; Tobacco use NPSG- 2007, AHI 12 per hour MSLT 12/13/06- done off CPAP, with snoring noted.  Mean Latency 4.3 minutes, SOREM 1/5   Test must be eval clinically ------------------------------------------------------------------------------------- . 05/28/2015-56 year old male smoker Former Jeremy Guerra patient followed for OSA, idiopathic hypersomnia; DME is [facility]-wears CPAP every night for about 4.5 hours or more. NPSG- 2007, AHI 12 per hour CPAP [employer]- auto 5-15. Pressure sometimes is a little too high Nuvigil 150 mg is not holding well enough. He falls asleep too easily at work especially during meetings. He expects this problem and his smoking habit to be much better when he retires in the next couple of years. We discussed alternative dosing of Nuvigil.  07/04/2016-56 year old male smoker followed for OSA, idiopathic hypersomnia CPAP [employer] auto 5-15 FOLLOWS FOR: DME: [facility]. Pt wears CPAP nightly;pressure working well for patient. Will need Rx sent for new supplies. DL attached. 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 Describes short sleep time. Still gets tired, occasional nap, continues Nuvigil. Having some seasonal pollen rhinitis symptoms in the last couple of weeks. No effort to stop smoking.  ROS-see HPI    "+" = pos Constitutional:    weight loss, night sweats, fevers, chills,+ fatigue, lassitudeHEENT:    headaches, difficulty swallowing, tooth/dental problems, sore throat,       sneezing, itching, ear ache, nasal congestion, post nasal drip, snoring CV:    chest pain, orthopnea, PND, swelling in lower extremities, anasarca,                                                    dizziness, palpitations Resp:   shortness of breath with exertion or at rest.                productive cough,   non-productive  cough, coughing up of blood.              change in color of mucus.  wheezing.   Skin:    rash or lesions. GI:  No-   heartburn, indigestion, abdominal pain, nausea, vomiting, diarrhea,                 change in bowel habits, loss of appetite GU: dysuria, change in color of urine, no urgency or frequency.   flank pain. MS:   joint pain, stiffness, decreased range of motion, back pain. Neuro-     nothing unusual Psych:  change in mood or affect.  depression or anxiety.   memory loss.    Objective:  OBJ- Physical Exam General- Alert, Oriented, Affect-appropriate, Distress- none acute, not obese, odor of Tobacco Skin- rash-none, lesions- none, excoriation- none Lymphadenopathy- none Head- atraumatic            Eyes- Gross vision intact, PERRLA, conjunctivae and secretions clear            Ears- Hearing, canals-normal            Nose- sniffing, Clear, no-Septal dev, mucus, polyps, erosion, perforation             Throat- Mallampati IV , mucosa clear , drainage- none, tonsils- atrophic Neck- flexible , trachea  midline, no stridor , thyroid nl, carotid no bruit Chest - symmetrical excursion , unlabored           Heart/CV- RRR , no murmur , no gallop  , no rub, nl s1 s2                           - JVD- none , edema- none, stasis changes- none, varices- none           Lung- clear to P&A, wheeze- none, cough- none , dullness-none, rub- none           Chest wall-  Abd-  Br/ Gen/ Rectal- Not done, not indicated Extrem- cyanosis- none, clubbing, none, atrophy- none, strength- nl Neuro- grossly intact to observation    Assessment & Plan:

## 2016-07-22 ENCOUNTER — Encounter: Payer: Self-pay | Admitting: Internal Medicine

## 2016-07-22 DIAGNOSIS — Z720 Tobacco use: Secondary | ICD-10-CM | POA: Insufficient documentation

## 2016-07-22 NOTE — Assessment & Plan Note (Signed)
Strong odor of tobacco. He clearly needs to stop smoking and this was emphasized and support provided.

## 2016-07-22 NOTE — Assessment & Plan Note (Signed)
The test was not done under standard circumstances (it did not follow an PSG and patient did not wear CPAP) so we have limited ability to interpret. 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 Education efforts again done. Suggested smaller travel CPAP might help

## 2016-07-22 NOTE — Assessment & Plan Note (Signed)
We discussed exploring use of a travel CPAP machine when he goes out of town to improve compliance.

## 2016-08-04 ENCOUNTER — Ambulatory Visit (INDEPENDENT_AMBULATORY_CARE_PROVIDER_SITE_OTHER): Payer: Commercial Managed Care - PPO | Admitting: Emergency Medicine

## 2016-08-04 ENCOUNTER — Encounter: Payer: Self-pay | Admitting: Gastroenterology

## 2016-08-04 VITALS — BP 105/70 | HR 64 | Temp 97.6°F | Resp 18 | Ht 71.0 in | Wt 182.4 lb

## 2016-08-04 DIAGNOSIS — Z0000 Encounter for general adult medical examination without abnormal findings: Secondary | ICD-10-CM | POA: Diagnosis not present

## 2016-08-04 LAB — POCT URINALYSIS DIP (MANUAL ENTRY)
Bilirubin, UA: NEGATIVE
Blood, UA: NEGATIVE
Glucose, UA: NEGATIVE mg/dL
Leukocytes, UA: NEGATIVE
Nitrite, UA: NEGATIVE
Spec Grav, UA: 1.02 (ref 1.010–1.025)
Urobilinogen, UA: 0.2 E.U./dL
pH, UA: 7 (ref 5.0–8.0)

## 2016-08-04 NOTE — Patient Instructions (Addendum)
IF you received an x-ray today, you will receive an invoice from [facility]. Please contact [facility] at [phone] with questions or concerns regarding your invoice.   IF you received labwork today, you will receive an invoice from [facility]. Please contact [facility] at [phone] with questions or concerns regarding your invoice.   Our billing staff will not be able to assist you with questions regarding bills from these companies.  You will be contacted with the lab results as soon as they are available. The fastest way to get your results is to activate your My Chart account. Instructions are located on the last page of this paperwork. If you have not heard from [place] regarding the results in 2 weeks, please contact this office.         Health Maintenance, Male A healthy lifestyle and preventive care is important for your health and wellness. Ask your health care provider about what schedule of regular examinations is right for you. What should I know about weight and diet?  Eat a Healthy Diet  Eat plenty of vegetables, fruits, whole grains, low-fat dairy products, and lean protein.  Do not eat a lot of foods high in solid fats, added sugars, or salt. Maintain a Healthy Weight  Regular exercise can help you achieve or maintain a healthy weight. You should:  Do at least 150 minutes of exercise each week. The exercise should increase your heart rate and make you sweat (moderate-intensity exercise).  Do strength-training exercises at least twice a week. Watch Your Levels of Cholesterol and Blood Lipids  Have your blood tested for lipids and cholesterol every 5 years starting at 55 years of age. If you are at high risk for heart disease, you should start having your blood tested when you are 55 years old. You may need to have your cholesterol levels checked more often if:  Your lipid or cholesterol levels are high.  You are older than 55 years of  age.  You are at high risk for heart disease. What should I know about cancer screening? Many types of cancers can be detected early and may often be prevented. Lung Cancer  You should be screened every year for lung cancer if:  You are a current smoker who has smoked for at least 30 years.  You are a former smoker who has quit within the past 15 years.  Talk to your health care provider about your screening options, when you should start screening, and how often you should be screened. Colorectal Cancer  Routine colorectal cancer screening usually begins at 55 years of age and should be repeated every 5-10 years until you are 55 years old. You may need to be screened more often if early forms of precancerous polyps or small growths are found. Your health care provider may recommend screening at an earlier age if you have risk factors for colon cancer.  Your health care provider may recommend using home test kits to check for hidden blood in the stool.  A small camera at the end of a tube can be used to examine your colon (sigmoidoscopy or colonoscopy). This checks for the earliest forms of colorectal cancer. Prostate and Testicular Cancer  Depending on your age and overall health, your health care provider may do certain tests to screen for prostate and testicular cancer.  Talk to your health care provider about any symptoms or concerns you have about testicular or prostate cancer. Skin Cancer  Check your skin from  head to toe regularly.  Tell your health care provider about any new moles or changes in moles, especially if:  There is a change in a mole's size, shape, or color.  You have a mole that is larger than a pencil eraser.  Always use sunscreen. Apply sunscreen liberally and repeat throughout the day.  Protect yourself by wearing long sleeves, pants, a wide-brimmed hat, and sunglasses when outside. What should I know about heart disease, diabetes, and high blood  pressure?  If you are 65-8 years of age, have your blood pressure checked every 3-5 years. If you are 60 years of age or older, have your blood pressure checked every year. You should have your blood pressure measured twice-once when you are at a hospital or clinic, and once when you are not at a hospital or clinic. Record the average of the two measurements. To check your blood pressure when you are not at a hospital or clinic, you can use:  An automated blood pressure machine at a pharmacy.  A home blood pressure monitor.  Talk to your health care provider about your target blood pressure.  If you are between 75-63 years old, ask your health care provider if you should take aspirin to prevent heart disease.  Have regular diabetes screenings by checking your fasting blood sugar level.  If you are at a normal weight and have a low risk for diabetes, have this test once every three years after the age of 91.  If you are overweight and have a high risk for diabetes, consider being tested at a younger age or more often.  A one-time screening for abdominal aortic aneurysm (AAA) by ultrasound is recommended for men aged 87-75 years who are current or former smokers. What should I know about preventing infection? Hepatitis B  If you have a higher risk for hepatitis B, you should be screened for this virus. Talk with your health care provider to find out if you are at risk for hepatitis B infection. Hepatitis C  Blood testing is recommended for:  Everyone born from [birth year] through 1964.  Anyone with known risk factors for hepatitis C. Sexually Transmitted Diseases (STDs)  You should be screened each year for STDs including gonorrhea and chlamydia if:  You are sexually active and are younger than 55 years of age.  You are older than 55 years of age and your health care provider tells you that you are at risk for this type of infection.  Your sexual activity has changed since you were last  screened and you are at an increased risk for chlamydia or gonorrhea. Ask your health care provider if you are at risk.  Talk with your health care provider about whether you are at high risk of being infected with HIV. Your health care provider may recommend a prescription medicine to help prevent HIV infection. What else can I do?  Schedule regular health, dental, and eye exams.  Stay current with your vaccines (immunizations).  Do not use any tobacco products, such as cigarettes, chewing tobacco, and e-cigarettes. If you need help quitting, ask your health care provider.  Limit alcohol intake to no more than 2 drinks per day. One drink equals 12 ounces of beer, 5 ounces of wine, or 1 ounces of hard liquor.  Do not use street drugs.  Do not share needles.  Ask your health care provider for help if you need support or information about quitting drugs.  Tell your health care provider if  you often feel depressed.  Tell your health care provider if you have ever been abused or do not feel safe at home. This information is not intended to replace advice given to you by your health care provider. Make sure you discuss any questions you have with your health care provider. Document Released: 09/24/2007 Document Revised: 11/25/2015 Document Reviewed: 12/30/2014 Elsevier Interactive Patient Education  2017 [facility]) Exercise Recommendation  Being physically active is important to prevent heart disease and stroke, the nation's No. 1and No. 5killers. To improve overall cardiovascular health, we suggest at least 150 minutes per week of moderate exercise or 75 minutes per week of vigorous exercise (or a combination of moderate and vigorous activity). Thirty minutes a day, five times a week is an easy goal to remember. You will also experience benefits even if you divide your time into two or three segments of 10 to 15 minutes per day.  For people who would  benefit from lowering their blood pressure or cholesterol, we recommend 40 minutes of aerobic exercise of moderate to vigorous intensity three to four times a week to lower the risk for heart attack and stroke.  Physical activity is anything that makes you move your body and burn calories.  This includes things like climbing stairs or playing sports. Aerobic exercises benefit your heart, and include walking, jogging, swimming or biking. Strength and stretching exercises are best for overall stamina and flexibility.  The simplest, positive change you can make to effectively improve your heart health is to start walking. It's enjoyable, free, easy, social and great exercise. A walking program is flexible and boasts high success rates because people can stick with it. It's easy for walking to become a regular and satisfying part of life.   For Overall Cardiovascular Health:  At least 30 minutes of moderate-intensity aerobic activity at least 5 days per week for a total of 150  OR   At least 25 minutes of vigorous aerobic activity at least 3 days per week for a total of 75 minutes; or a combination of moderate- and vigorous-intensity aerobic activity  AND   Moderate- to high-intensity muscle-strengthening activity at least 2 days per week for additional health benefits.  For Lowering Blood Pressure and Cholesterol  An average 40 minutes of moderate- to vigorous-intensity aerobic activity 3 or 4 times per week  What if I can't make it to the time goal? Something is always better than nothing! And everyone has to start somewhere. Even if you've been sedentary for years, today is the day you can begin to make healthy changes in your life. If you don't think you'll make it for 30 or 40 minutes, set a reachable goal for today. You can work up toward your overall goal by increasing your time as you get stronger. Don't let all-or-nothing thinking rob you of doing what you can every day.   Source:http://www.heart.org

## 2016-08-04 NOTE — Progress Notes (Signed)
Jeremy Guerra 56 y.o.   Chief Complaint  Patient presents with  . Annual Exam    HISTORY OF PRESENT ILLNESS: This is a 56 y.o. 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  HPI   Prior to Admission medications   Medication Sig Start Date End Date Taking? Authorizing Provider  Armodafinil 250 MG tablet Take 1 tablet (250 mg total) by mouth daily. 05/24/16  Yes Deneise Lever, MD  Ascorbic Acid (VITAMIN C) 1000 MG tablet Take 1,000 mg by mouth daily.   Yes Historical Provider, MD  Cyanocobalamin (VITAMIN B 12 PO) Take 100 mcg by mouth daily.   Yes Historical Provider, MD  Multiple Vitamin (MULTIVITAMIN) tablet Take 1 tablet by mouth daily.   Yes Historical Provider, MD    No Known Allergies  Patient Active Problem List   Diagnosis Date Noted  . Tobacco abuse 07/22/2016  . Idiopathic hypersomnia 07/29/2013  . Obstructive sleep apnea 03/28/2007    Past Medical History:  Diagnosis Date  . Arthritis   . Chronic headaches   . Hypercholesteremia   . Hypogonadism male   . Sleep apnea     Past Surgical History:  Procedure Laterality Date  . FRACTURE SURGERY    . [place]    Social History   Social History  . Marital status: Married    Spouse name: N/A  . Number of children: N/A  . Years of education: N/A   Occupational History  . Not on file.   Social History Main Topics  . Smoking status: Heavy Tobacco Smoker    Packs/day: 0.50    Years: 39.00    Types: Cigarettes  . Smokeless tobacco: Never Used     Comment: Also uses e-cig   . Alcohol use No  . Drug use: No  . Sexual activity: Not on file   Other Topics Concern  . Not on file   Social History Narrative  . No narrative on file    Family History  Problem Relation Age of Onset  . Hypertension Father   . Diabetes Father      Review of Systems  Constitutional: Negative.  Negative for chills, fever and  weight loss.  HENT: Negative.  Negative for congestion, hearing loss and sore throat.   Eyes: Negative.  Negative for blurred vision, double vision, discharge and redness.  Respiratory: Negative.  Negative for cough and shortness of breath.   Cardiovascular: Negative.  Negative for chest pain, palpitations, claudication and leg swelling.  Gastrointestinal: Negative.  Negative for abdominal pain, diarrhea, nausea and vomiting.  Genitourinary: Negative.  Negative for dysuria and hematuria.  Musculoskeletal: Positive for joint pain. Negative for myalgias and neck pain.  Skin: Negative.  Negative for rash.  Neurological: Negative for dizziness, sensory change, focal weakness and headaches.  Endo/Heme/Allergies: Negative.   Psychiatric/Behavioral: Positive for depression. The patient is nervous/anxious and has insomnia.   All other systems reviewed and are negative.  Vitals:   08/04/16 0810  BP: 105/70  Pulse: 64  Resp: 18  Temp: 97.6 F (36.4 C)     Physical Exam  Constitutional: He is oriented to person, place, and time. He appears well-developed and well-nourished.  HENT:  Head: Normocephalic and atraumatic.  Mouth/Throat: Oropharynx is clear and moist. No oropharyngeal exudate.  Eyes: Conjunctivae and EOM are normal. Pupils are equal, round, and reactive to light.  Wears eyeglasses  Neck:  Normal range of motion. Neck supple.  Pulmonary/Chest: Effort normal and breath sounds normal.  Abdominal: Soft. Bowel sounds are normal. He exhibits no distension. There is no tenderness.  Musculoskeletal: Normal range of motion.  Neurological: He is alert and oriented to person, place, and time. No sensory deficit. He exhibits normal muscle tone.  Skin: Skin is warm and dry. Capillary refill takes less than 2 seconds. No rash noted.  Psychiatric: He has a normal mood and affect. His behavior is normal.  Vitals reviewed.    ASSESSMENT & PLAN: Jeremy Guerra was seen today for annual  exam.  Diagnoses and all orders for this visit:  Routine general medical examination at a health care facility -     CBC with Differential -     Comprehensive metabolic panel -     Lipid panel -     TSH -     Hemoglobin A1c -     PSA(Must document that pt has been informed of limitations of PSA testing.) -     Hepatitis C antibody screen -     HIV antibody -     Ambulatory referral to Gastroenterology -     POCT urinalysis dipstick    Patient Instructions       IF you received an x-ray today, you will receive an invoice from [facility]. Please contact [facility] at [phone] with questions or concerns regarding your invoice.   IF you received labwork today, you will receive an invoice from [facility]. Please contact [facility] at [phone] with questions or concerns regarding your invoice.   Our billing staff will not be able to assist you with questions regarding bills from these companies.  You will be contacted with the lab results as soon as they are available. The fastest way to get your results is to activate your My Chart account. Instructions are located on the last page of this paperwork. If you have not heard from [place] regarding the results in 2 weeks, please contact this office.         Health Maintenance, Male A healthy lifestyle and preventive care is important for your health and wellness. Ask your health care provider about what schedule of regular examinations is right for you. What should I know about weight and diet?  Eat a Healthy Diet  Eat plenty of vegetables, fruits, whole grains, low-fat dairy products, and lean protein.  Do not eat a lot of foods high in solid fats, added sugars, or salt. Maintain a Healthy Weight  Regular exercise can help you achieve or maintain a healthy weight. You should:  Do at least 150 minutes of exercise each week. The exercise should increase your heart rate and make you sweat (moderate-intensity  exercise).  Do strength-training exercises at least twice a week. Watch Your Levels of Cholesterol and Blood Lipids  Have your blood tested for lipids and cholesterol every 5 years starting at 55 years of age. If you are at high risk for heart disease, you should start having your blood tested when you are 55 years old. You may need to have your cholesterol levels checked more often if:  Your lipid or cholesterol levels are high.  You are older than 55 years of age.  You are at high risk for heart disease. What should I know about cancer screening? Many types of cancers can be detected early and may often be prevented. Lung Cancer  You should be screened every year for lung cancer if:  You are  a current smoker who has smoked for at least 30 years.  You are a former smoker who has quit within the past 15 years.  Talk to your health care provider about your screening options, when you should start screening, and how often you should be screened. Colorectal Cancer  Routine colorectal cancer screening usually begins at 56 years of age and should be repeated every 5-10 years until you are 55 years old. You may need to be screened more often if early forms of precancerous polyps or small growths are found. Your health care provider may recommend screening at an earlier age if you have risk factors for colon cancer.  Your health care provider may recommend using home test kits to check for hidden blood in the stool.  A small camera at the end of a tube can be used to examine your colon (sigmoidoscopy or colonoscopy). This checks for the earliest forms of colorectal cancer. Prostate and Testicular Cancer  Depending on your age and overall health, your health care provider may do certain tests to screen for prostate and testicular cancer.  Talk to your health care provider about any symptoms or concerns you have about testicular or prostate cancer. Skin Cancer  Check your skin from head  to toe regularly.  Tell your health care provider about any new moles or changes in moles, especially if:  There is a change in a mole's size, shape, or color.  You have a mole that is larger than a pencil eraser.  Always use sunscreen. Apply sunscreen liberally and repeat throughout the day.  Protect yourself by wearing long sleeves, pants, a wide-brimmed hat, and sunglasses when outside. What should I know about heart disease, diabetes, and high blood pressure?  If you are 75-19 years of age, have your blood pressure checked every 3-5 years. If you are 37 years of age or older, have your blood pressure checked every year. You should have your blood pressure measured twice-once when you are at a hospital or clinic, and once when you are not at a hospital or clinic. Record the average of the two measurements. To check your blood pressure when you are not at a hospital or clinic, you can use:  An automated blood pressure machine at a pharmacy.  A home blood pressure monitor.  Talk to your health care provider about your target blood pressure.  If you are between 79-56 years old, ask your health care provider if you should take aspirin to prevent heart disease.  Have regular diabetes screenings by checking your fasting blood sugar level.  If you are at a normal weight and have a low risk for diabetes, have this test once every three years after the age of 60.  If you are overweight and have a high risk for diabetes, consider being tested at a younger age or more often.  A one-time screening for abdominal aortic aneurysm (AAA) by ultrasound is recommended for men aged 79-75 years who are current or former smokers. What should I know about preventing infection? Hepatitis B  If you have a higher risk for hepatitis B, you should be screened for this virus. Talk with your health care provider to find out if you are at risk for hepatitis B infection. Hepatitis C  Blood testing is  recommended for:  Everyone born from [birth year] through 1964.  Anyone with known risk factors for hepatitis C. Sexually Transmitted Diseases (STDs)  You should be screened each year for STDs including gonorrhea and  chlamydia if:  You are sexually active and are younger than 54 years of age.  You are older than 55 years of age and your health care provider tells you that you are at risk for this type of infection.  Your sexual activity has changed since you were last screened and you are at an increased risk for chlamydia or gonorrhea. Ask your health care provider if you are at risk.  Talk with your health care provider about whether you are at high risk of being infected with HIV. Your health care provider may recommend a prescription medicine to help prevent HIV infection. What else can I do?  Schedule regular health, dental, and eye exams.  Stay current with your vaccines (immunizations).  Do not use any tobacco products, such as cigarettes, chewing tobacco, and e-cigarettes. If you need help quitting, ask your health care provider.  Limit alcohol intake to no more than 2 drinks per day. One drink equals 12 ounces of beer, 5 ounces of wine, or 1 ounces of hard liquor.  Do not use street drugs.  Do not share needles.  Ask your health care provider for help if you need support or information about quitting drugs.  Tell your health care provider if you often feel depressed.  Tell your health care provider if you have ever been abused or do not feel safe at home. This information is not intended to replace advice given to you by your health care provider. Make sure you discuss any questions you have with your health care provider. Document Released: 09/24/2007 Document Revised: 11/25/2015 Document Reviewed: 12/30/2014 Elsevier Interactive Patient Education  2017 [facility] Exercise Recommendation  Being physically active is important to prevent  heart disease and stroke, the nation's No. 1and No. 5killers. To improve overall cardiovascular health, we suggest at least 150 minutes per week of moderate exercise or 75 minutes per week of vigorous exercise (or a combination of moderate and vigorous activity). Thirty minutes a day, five times a week is an easy goal to remember. You will also experience benefits even if you divide your time into two or three segments of 10 to 15 minutes per day.  For people who would benefit from lowering their blood pressure or cholesterol, we recommend 40 minutes of aerobic exercise of moderate to vigorous intensity three to four times a week to lower the risk for heart attack and stroke.  Physical activity is anything that makes you move your body and burn calories.  This includes things like climbing stairs or playing sports. Aerobic exercises benefit your heart, and include walking, jogging, swimming or biking. Strength and stretching exercises are best for overall stamina and flexibility.  The simplest, positive change you can make to effectively improve your heart health is to start walking. It's enjoyable, free, easy, social and great exercise. A walking program is flexible and boasts high success rates because people can stick with it. It's easy for walking to become a regular and satisfying part of life.   For Overall Cardiovascular Health:  At least 30 minutes of moderate-intensity aerobic activity at least 5 days per week for a total of 150  OR   At least 25 minutes of vigorous aerobic activity at least 3 days per week for a total of 75 minutes; or a combination of moderate- and vigorous-intensity aerobic activity  AND   Moderate- to high-intensity muscle-strengthening activity at least 2 days per week for additional health benefits.  For  Lowering Blood Pressure and Cholesterol  An average 40 minutes of moderate- to vigorous-intensity aerobic activity 3 or 4 times per week  What if I can't  make it to the time goal? Something is always better than nothing! And everyone has to start somewhere. Even if you've been sedentary for years, today is the day you can begin to make healthy changes in your life. If you don't think you'll make it for 30 or 40 minutes, set a reachable goal for today. You can work up toward your overall goal by increasing your time as you get stronger. Don't let all-or-nothing thinking rob you of doing what you can every day.  Source:http://www.heart.Burnadette Pop, MD [facility]

## 2016-08-05 ENCOUNTER — Encounter: Payer: Self-pay | Admitting: Radiology

## 2016-08-05 LAB — COMPREHENSIVE METABOLIC PANEL
ALT: 21 IU/L (ref 0–44)
AST: 16 IU/L (ref 0–40)
Albumin/Globulin Ratio: 2.4 — ABNORMAL HIGH (ref 1.2–2.2)
Albumin: 4.7 g/dL (ref 3.5–5.5)
Alkaline Phosphatase: 73 IU/L (ref 39–117)
BUN/Creatinine Ratio: 9 (ref 9–20)
BUN: 11 mg/dL (ref 6–24)
Bilirubin Total: 0.6 mg/dL (ref 0.0–1.2)
CO2: 26 mmol/L (ref 18–29)
Calcium: 9.6 mg/dL (ref 8.7–10.2)
Chloride: 99 mmol/L (ref 96–106)
Creatinine, Ser: 1.2 mg/dL (ref 0.76–1.27)
GFR calc Af Amer: 79 mL/min/{1.73_m2} (ref >59)
GFR calc non Af Amer: 68 mL/min/{1.73_m2} (ref >59)
Globulin, Total: 2 g/dL (ref 1.5–4.5)
Glucose: 111 mg/dL — ABNORMAL HIGH (ref 65–99)
Potassium: 4.6 mmol/L (ref 3.5–5.2)
Sodium: 143 mmol/L (ref 134–144)
Total Protein: 6.7 g/dL (ref 6.0–8.5)

## 2016-08-05 LAB — PSA: Prostate Specific Ag, Serum: 0.7 ng/mL (ref 0.0–4.0)

## 2016-08-05 LAB — CBC WITH DIFFERENTIAL/PLATELET
Basophils Absolute: 0 10*3/uL (ref 0.0–0.2)
Basos: 0 %
EOS (ABSOLUTE): 0.2 10*3/uL (ref 0.0–0.4)
Eos: 2 %
Hematocrit: 46 % (ref 37.5–51.0)
Hemoglobin: 16.4 g/dL (ref 13.0–17.7)
Immature Grans (Abs): 0 10*3/uL (ref 0.0–0.1)
Immature Granulocytes: 0 %
Lymphocytes Absolute: 1.2 10*3/uL (ref 0.7–3.1)
Lymphs: 16 %
MCH: 32.9 pg (ref 26.6–33.0)
MCHC: 35.7 g/dL (ref 31.5–35.7)
MCV: 92 fL (ref 79–97)
Monocytes Absolute: 0.5 10*3/uL (ref 0.1–0.9)
Monocytes: 7 %
Neutrophils Absolute: 5.5 10*3/uL (ref 1.4–7.0)
Neutrophils: 75 %
Platelets: 139 10*3/uL — ABNORMAL LOW (ref 150–379)
RBC: 4.98 x10E6/uL (ref 4.14–5.80)
RDW: 13.3 % (ref 12.3–15.4)
WBC: 7.4 10*3/uL (ref 3.4–10.8)

## 2016-08-05 LAB — LIPID PANEL
Chol/HDL Ratio: 4.6 ratio (ref 0.0–5.0)
Cholesterol, Total: 197 mg/dL (ref 100–199)
HDL: 43 mg/dL (ref >39)
LDL Calculated: 108 mg/dL — ABNORMAL HIGH (ref 0–99)
Triglycerides: 231 mg/dL — ABNORMAL HIGH (ref 0–149)
VLDL Cholesterol Cal: 46 mg/dL — ABNORMAL HIGH (ref 5–40)

## 2016-08-05 LAB — HEMOGLOBIN A1C
Est. average glucose Bld gHb Est-mCnc: 117 mg/dL
Hgb A1c MFr Bld: 5.7 % — ABNORMAL HIGH (ref 4.8–5.6)

## 2016-08-05 LAB — TSH: TSH: 1.89 u[IU]/mL (ref 0.450–4.500)

## 2016-08-05 LAB — HIV ANTIBODY (ROUTINE TESTING W REFLEX): HIV Screen 4th Generation wRfx: NONREACTIVE

## 2016-08-05 LAB — HEPATITIS C ANTIBODY: Hep C Virus Ab: <0.1 s/co ratio (ref 0.0–0.9)

## 2016-08-19 ENCOUNTER — Encounter: Payer: Self-pay | Admitting: *Deleted

## 2016-09-03 ENCOUNTER — Other Ambulatory Visit: Payer: Self-pay | Admitting: Internal Medicine

## 2016-09-06 ENCOUNTER — Encounter: Payer: Self-pay | Admitting: Gastroenterology

## 2016-09-06 ENCOUNTER — Ambulatory Visit (AMBULATORY_SURGERY_CENTER): Payer: Self-pay

## 2016-09-06 VITALS — Ht 71.0 in | Wt 184.8 lb

## 2016-09-06 DIAGNOSIS — Z8371 Family history of colonic polyps: Secondary | ICD-10-CM

## 2016-09-06 MED ORDER — NA SULFATE-K SULFATE-MG SULF 17.5-3.13-1.6 GM/177ML PO SOLN: ORAL | 0 refills | Status: DC

## 2016-09-06 NOTE — Progress Notes (Signed)
Per pt, no allergies to soy or egg products.Pt not taking any weight loss meds or using  O2 at home.   Pt refused Emmi video. 

## 2016-09-08 NOTE — Telephone Encounter (Signed)
Ok refill for total 6 months

## 2016-09-08 NOTE — Telephone Encounter (Signed)
Please advise if ok to send in refills of medication.  thanks

## 2016-09-20 ENCOUNTER — Ambulatory Visit (AMBULATORY_SURGERY_CENTER): Payer: Commercial Managed Care - PPO | Admitting: Gastroenterology

## 2016-09-20 ENCOUNTER — Encounter: Payer: Self-pay | Admitting: Gastroenterology

## 2016-09-20 VITALS — BP 122/68 | HR 50 | Temp 97.8°F | Resp 13 | Ht 71.0 in | Wt 182.0 lb

## 2016-09-20 DIAGNOSIS — Z1212 Encounter for screening for malignant neoplasm of rectum: Secondary | ICD-10-CM

## 2016-09-20 DIAGNOSIS — Z1211 Encounter for screening for malignant neoplasm of colon: Secondary | ICD-10-CM

## 2016-09-20 DIAGNOSIS — D123 Benign neoplasm of transverse colon: Secondary | ICD-10-CM

## 2016-09-20 MED ORDER — SODIUM CHLORIDE 0.9 % IV SOLN: 500.0000 mL | INTRAVENOUS | Status: AC

## 2016-09-20 NOTE — Op Note (Addendum)
[place] Patient Name: Jeremy Guerra Procedure Date: 09/20/2016 3:01 PM MRN: [id number] Endoscopist: Ladene Artist , MD Age: 56 Referring MD:  Date of Birth: 03-Apr-1961 Gender: Male Account #: [id number] Procedure:                Colonoscopy Indications:              Screening for colorectal malignant neoplasm Medicines:                Monitored Anesthesia Care Procedure:                Pre-Anesthesia Assessment:                           - Prior to the procedure, a History and Physical                            was performed, and patient medications and                            allergies were reviewed. The patient's tolerance of                            previous anesthesia was also reviewed. The risks                            and benefits of the procedure and the sedation                            options and risks were discussed with the patient.                            All questions were answered, and informed consent                            was obtained. Prior Anticoagulants: The patient has                            taken no previous anticoagulant or antiplatelet                            agents. ASA Grade Assessment: II - A patient with                            mild systemic disease. After reviewing the risks                            and benefits, the patient was deemed in                            satisfactory condition to undergo the procedure.                           After obtaining informed consent, the colonoscope  was passed under direct vision. Throughout the                            procedure, the patient's blood pressure, pulse, and                            oxygen saturations were monitored continuously. The                            Colonoscope was introduced through the anus and                            advanced to the the cecum, identified by                            appendiceal orifice and  ileocecal valve. The                            ileocecal valve, appendiceal orifice, and rectum                            were photographed. The quality of the bowel                            preparation was good. The colonoscopy was performed                            without difficulty. The patient tolerated the                            procedure well. Scope In: 3:09:29 PM Scope Out: 3:21:23 PM Scope Withdrawal Time: 0 hours 10 minutes 9 seconds  Total Procedure Duration: 0 hours 11 minutes 54 seconds  Findings:                 The perianal and digital rectal examinations were                            normal.                           A 4 mm polyp was found in the transverse colon. The                            polyp was sessile. The polyp was removed with a                            cold forceps. Resection and retrieval were complete.                           The exam was otherwise without abnormality on                            direct and retroflexion views. Complications:  No immediate complications. Estimated blood loss:                            None. Estimated Blood Loss:     Estimated blood loss: none. Impression:               - One 4 mm polyp in the transverse colon, removed                            with a cold forceps. Resected and retrieved.                           - The examination was otherwise normal on direct                            and retroflexion views. Recommendation:           - Repeat colonoscopy in 5 years for surveillance if                            polyp is precancerous, otherwise 10 years for                            screening.                           - Patient has a contact number available for                            emergencies. The signs and symptoms of potential                            delayed complications were discussed with the                            patient. Return to normal activities tomorrow.                             Written discharge instructions were provided to the                            patient.                           - Resume previous diet.                           - Continue present medications.                           - Await pathology results. Ladene Artist, MD 09/20/2016 3:23:54 PM This report has been signed electronically.

## 2016-09-20 NOTE — Patient Instructions (Signed)
YOU HAD AN ENDOSCOPIC PROCEDURE TODAY AT THE [facility]:   Refer to the procedure report that was given to you for any specific questions about what was found during the examination.  If the procedure report does not answer your questions, please call your gastroenterologist to clarify.  If you requested that your care partner not be given the details of your procedure findings, then the procedure report has been included in a sealed envelope for you to review at your convenience later.  YOU SHOULD EXPECT: Some feelings of bloating in the abdomen. Passage of more gas than usual.  Walking can help get rid of the air that was put into your GI tract during the procedure and reduce the bloating. If you had a lower endoscopy (such as a colonoscopy or flexible sigmoidoscopy) you may notice spotting of blood in your stool or on the toilet paper. If you underwent a bowel prep for your procedure, you may not have a normal bowel movement for a few days.  Please Note:  You might notice some irritation and congestion in your nose or some drainage.  This is from the oxygen used during your procedure.  There is no need for concern and it should clear up in a day or so.  SYMPTOMS TO REPORT IMMEDIATELY:   Following lower endoscopy (colonoscopy or flexible sigmoidoscopy):  Excessive amounts of blood in the stool  Significant tenderness or worsening of abdominal pains  Swelling of the abdomen that is new, acute  Fever of 100F or higher   For urgent or emergent issues, a gastroenterologist can be reached at any hour by calling [phone].   DIET:  We do recommend a small meal at first, but then you may proceed to your regular diet.  Drink plenty of fluids but you should avoid alcoholic beverages for 24 hours.  ACTIVITY:  You should plan to take it easy for the rest of today and you should NOT DRIVE or use heavy machinery until tomorrow (because of the sedation medicines used during the test).     FOLLOW UP: Our staff will call the number listed on your records the next business day following your procedure to check on you and address any questions or concerns that you may have regarding the information given to you following your procedure. If we do not reach you, we will leave a message.  However, if you are feeling well and you are not experiencing any problems, there is no need to return our call.  We will assume that you have returned to your regular daily activities without incident.  If any biopsies were taken you will be contacted by phone or by letter within the next 1-3 weeks.  Please call us at [phone] if you have not heard about the biopsies in 3 weeks.    SIGNATURES/CONFIDENTIALITY: You and/or your care partner have signed paperwork which will be entered into your electronic medical record.  These signatures attest to the fact that that the information above on your After Visit Summary has been reviewed and is understood.  Full responsibility of the confidentiality of this discharge information lies with you and/or your care-partner.  Read all of the handouts given to you by your recovery room nurse. 

## 2016-09-20 NOTE — Progress Notes (Signed)
Called to room to assist during endoscopic procedure.  Patient ID and intended procedure confirmed with present staff. Received instructions for my participation in the procedure from the performing physician.  

## 2016-09-20 NOTE — Progress Notes (Signed)
A and O x3. Report to RN. Tolerated MAC anesthesia well.

## 2016-09-21 ENCOUNTER — Telehealth: Payer: Self-pay | Admitting: *Deleted

## 2016-09-21 NOTE — Telephone Encounter (Signed)
  Follow up Call-  Call back number 09/20/2016  Post procedure Call Back phone  # [phone]  Permission to leave phone message Yes  Some recent data might be hidden     Patient questions:  Do you have a fever, pain , or abdominal swelling? No. Pain Score  0 *  Have you tolerated food without any problems? Yes.    Have you been able to return to your normal activities? Yes.    Do you have any questions about your discharge instructions: Diet   No. Medications  No. Follow up visit  No.  Do you have questions or concerns about your Care? No.  Actions: * If pain score is 4 or above: No action needed, pain <4.

## 2016-09-27 ENCOUNTER — Encounter: Payer: Self-pay | Admitting: Gastroenterology

## 2017-07-04 ENCOUNTER — Ambulatory Visit: Payer: Commercial Managed Care - PPO | Admitting: Internal Medicine

## 2018-02-20 ENCOUNTER — Encounter: Payer: Self-pay | Admitting: Family Medicine

## 2018-02-20 ENCOUNTER — Other Ambulatory Visit: Payer: Self-pay

## 2018-02-20 ENCOUNTER — Ambulatory Visit: Payer: BLUE CROSS/BLUE SHIELD | Admitting: Family Medicine

## 2018-02-20 VITALS — BP 148/74 | HR 59 | Temp 98.2°F | Ht 72.0 in | Wt 179.2 lb

## 2018-02-20 DIAGNOSIS — Z0000 Encounter for general adult medical examination without abnormal findings: Secondary | ICD-10-CM | POA: Diagnosis not present

## 2018-02-20 DIAGNOSIS — Z131 Encounter for screening for diabetes mellitus: Secondary | ICD-10-CM

## 2018-02-20 DIAGNOSIS — Z23 Encounter for immunization: Secondary | ICD-10-CM | POA: Diagnosis not present

## 2018-02-20 DIAGNOSIS — E785 Hyperlipidemia, unspecified: Secondary | ICD-10-CM

## 2018-02-20 NOTE — Patient Instructions (Addendum)
Please follow up with Dr. Mitchel Honour or sleep specialist to discuss Nuvigil or sleep meds.   Let us now if we can help with quitting smoking.   Please return for fasting labs when possible.   Blood pressure borderline elevated. Keep a record of your blood pressures outside of the office and bring them to the next office visit.  Schedule eye care provider appointment when possible.   Steps to Quit Smoking Smoking tobacco can be harmful to your health and can affect almost every organ in your body. Smoking puts you, and those around you, at risk for developing many serious chronic diseases. Quitting smoking is difficult, but it is one of the best things that you can do for your health. It is never too late to quit. What are the benefits of quitting smoking? When you quit smoking, you lower your risk of developing serious diseases and conditions, such as:  Lung cancer or lung disease, such as COPD.  Heart disease.  Stroke.  Heart attack.  Infertility.  Osteoporosis and bone fractures.  Additionally, symptoms such as coughing, wheezing, and shortness of breath may get better when you quit. You may also find that you get sick less often because your body is stronger at fighting off colds and infections. If you are pregnant, quitting smoking can help to reduce your chances of having a baby of low birth weight. How do I get ready to quit? When you decide to quit smoking, create a plan to make sure that you are successful. Before you quit:  Pick a date to quit. Set a date within the next two weeks to give you time to prepare.  Write down the reasons why you are quitting. Keep this list in places where you will see it often, such as on your bathroom mirror or in your car or wallet.  Identify the people, places, things, and activities that make you want to smoke (triggers) and avoid them. Make sure to take these actions: ? Throw away all cigarettes at home, at work, and in your car. ? Throw  away smoking accessories, such as Scientist, research (medical). ? Clean your car and make sure to empty the ashtray. ? Clean your home, including curtains and carpets.  Tell your family, friends, and coworkers that you are quitting. Support from your loved ones can make quitting easier.  Talk with your health care provider about your options for quitting smoking.  Find out what treatment options are covered by your health insurance.  What strategies can I use to quit smoking? Talk with your healthcare provider about different strategies to quit smoking. Some strategies include:  Quitting smoking altogether instead of gradually lessening how much you smoke over a period of time. Research shows that quitting "cold [place]" is more successful than gradually quitting.  Attending in-person counseling to help you build problem-solving skills. You are more likely to have success in quitting if you attend several counseling sessions. Even short sessions of 10 minutes can be effective.  Finding resources and support systems that can help you to quit smoking and remain smoke-free after you quit. These resources are most helpful when you use them often. They can include: ? Online chats with a Social worker. ? Telephone quitlines. ? Careers information officer. ? Support groups or group counseling. ? Text messaging programs. ? Mobile phone applications.  Taking medicines to help you quit smoking. (If you are pregnant or breastfeeding, talk with your health care provider first.) Some medicines contain nicotine and some  do not. Both types of medicines help with cravings, but the medicines that include nicotine help to relieve withdrawal symptoms. Your health care provider may recommend: ? Nicotine patches, gum, or lozenges. ? Nicotine inhalers or sprays. ? Non-nicotine medicine that is taken by mouth.  Talk with your health care provider about combining strategies, such as taking medicines while you are also  receiving in-person counseling. Using these two strategies together makes you more likely to succeed in quitting than if you used either strategy on its own. If you are pregnant or breastfeeding, talk with your health care provider about finding counseling or other support strategies to quit smoking. Do not take medicine to help you quit smoking unless told to do so by your health care provider. What things can I do to make it easier to quit? Quitting smoking might feel overwhelming at first, but there is a lot that you can do to make it easier. Take these important actions:  Reach out to your family and friends and ask that they support and encourage you during this time. Call telephone quitlines, reach out to support groups, or work with a counselor for support.  Ask people who smoke to avoid smoking around you.  Avoid places that trigger you to smoke, such as bars, parties, or smoke-break areas at work.  Spend time around people who do not smoke.  Lessen stress in your life, because stress can be a smoking trigger for some people. To lessen stress, try: ? Exercising regularly. ? Deep-breathing exercises. ? Yoga. ? Meditating. ? Performing a body scan. This involves closing your eyes, scanning your body from head to toe, and noticing which parts of your body are particularly tense. Purposefully relax the muscles in those areas.  Download or purchase mobile phone or tablet apps (applications) that can help you stick to your quit plan by providing reminders, tips, and encouragement. There are many free apps, such as QuitGuide from the [employer] Office manager for Disease Control and Prevention). You can find other support for quitting smoking (smoking cessation) through smokefree.gov and other websites.  How will I feel when I quit smoking? Within the first 24 hours of quitting smoking, you may start to feel some withdrawal symptoms. These symptoms are usually most noticeable 2-3 days after quitting, but  they usually do not last beyond 2-3 weeks. Changes or symptoms that you might experience include:  Mood swings.  Restlessness, anxiety, or irritation.  Difficulty concentrating.  Dizziness.  Strong cravings for sugary foods in addition to nicotine.  Mild weight gain.  Constipation.  Nausea.  Coughing or a sore throat.  Changes in how your medicines work in your body.  A depressed mood.  Difficulty sleeping (insomnia).  After the first 2-3 weeks of quitting, you may start to notice more positive results, such as:  Improved sense of smell and taste.  Decreased coughing and sore throat.  Slower heart rate.  Lower blood pressure.  Clearer skin.  The ability to breathe more easily.  Fewer sick days.  Quitting smoking is very challenging for most people. Do not get discouraged if you are not successful the first time. Some people need to make many attempts to quit before they achieve long-term success. Do your best to stick to your quit plan, and talk with your health care provider if you have any questions or concerns. This information is not intended to replace advice given to you by your health care provider. Make sure you discuss any questions you have with  your health care provider. Document Released: 03/22/2001 Document Revised: 11/24/2015 Document Reviewed: 08/12/2014 Elsevier Interactive Patient Education  2018 [place] you healthy  Get these tests  Blood pressure- Have your blood pressure checked once a year by your healthcare provider.  Normal blood pressure is 120/80  Weight- Have your body mass index (BMI) calculated to screen for obesity.  BMI is a measure of body fat based on height and weight. You can also calculate your own BMI at [email or web address].  Cholesterol- Have your cholesterol checked every year.  Diabetes- Have your blood sugar checked regularly if you have high blood pressure, high cholesterol, have a family history  of diabetes or if you are overweight.  Screening for Colon Cancer- Colonoscopy starting at age 43.  Screening may begin sooner depending on your family history and other health conditions. Follow up colonoscopy as directed by your Gastroenterologist.  Screening for Prostate Cancer- Both blood work (PSA) and a rectal exam help screen for Prostate Cancer.  Screening begins at age 8 with African-American men and at age 14 with Caucasian men.  Screening may begin sooner depending on your family history.  Take these medicines  Aspirin- One aspirin daily can help prevent Heart disease and Stroke.  Flu shot- Every fall.  Tetanus- Every 10 years.  Zostavax- Once after the age of 54 to prevent Shingles.  Pneumonia shot- Once after the age of 66; if you are younger than 29, ask your healthcare provider if you need a Pneumonia shot.  Take these steps  Don't smoke- If you do smoke, talk to your doctor about quitting.  For tips on how to quit, go to www.smokefree.gov or call 1-800-QUIT-NOW.  Be physically active- Exercise 5 days a week for at least 30 minutes.  If you are not already physically active start slow and gradually work up to 30 minutes of moderate physical activity.  Examples of moderate activity include walking briskly, mowing the yard, dancing, swimming, bicycling, etc.  Eat a healthy diet- Eat a variety of healthy food such as fruits, vegetables, low fat milk, low fat cheese, yogurt, lean meant, poultry, fish, beans, tofu, etc. For more information go to www.thenutritionsource.org  Drink alcohol in moderation- Limit alcohol intake to less than two drinks a day. Never drink and drive.  Dentist- Brush and floss twice daily; visit your dentist twice a year.  Depression- Your emotional health is as important as your physical health. If you're feeling down, or losing interest in things you would normally enjoy please talk to your healthcare provider.  Eye exam- Visit your eye doctor  every year.  Safe sex- If you may be exposed to a sexually transmitted infection, use a condom.  Seat belts- Seat belts can save your life; always wear one.  Smoke/Carbon Monoxide detectors- These detectors need to be installed on the appropriate level of your home.  Replace batteries at least once a year.  Skin cancer- When out in the sun, cover up and use sunscreen 15 SPF or higher.  Violence- If anyone is threatening you, please tell your healthcare provider.  Living Will/ Health care power of attorney- Speak with your healthcare provider and family.  If you have lab work done today you will be contacted with your lab results within the next 2 weeks.  If you have not heard from [place] then please contact us. The fastest way to get your results is to register for My Chart.   IF you received an x-ray today, you  will receive an invoice from [facility]. Please contact [facility] at [phone] with questions or concerns regarding your invoice.   IF you received labwork today, you will receive an invoice from [facility]. Please contact [facility] at [phone] with questions or concerns regarding your invoice.   Our billing staff will not be able to assist you with questions regarding bills from these companies.  You will be contacted with the lab results as soon as they are available. The fastest way to get your results is to activate your My Chart account. Instructions are located on the last page of this paperwork. If you have not heard from [place] regarding the results in 2 weeks, please contact this office.
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
## 2018-02-22 ENCOUNTER — Ambulatory Visit (INDEPENDENT_AMBULATORY_CARE_PROVIDER_SITE_OTHER): Payer: BLUE CROSS/BLUE SHIELD | Admitting: Family Medicine

## 2018-02-22 DIAGNOSIS — Z131 Encounter for screening for diabetes mellitus: Secondary | ICD-10-CM

## 2018-02-22 DIAGNOSIS — E785 Hyperlipidemia, unspecified: Secondary | ICD-10-CM

## 2018-02-22 NOTE — Progress Notes (Signed)
Lab only visit 

## 2018-02-23 LAB — COMPREHENSIVE METABOLIC PANEL
ALT: 18 IU/L (ref 0–44)
AST: 15 IU/L (ref 0–40)
Albumin/Globulin Ratio: 2.3 — ABNORMAL HIGH (ref 1.2–2.2)
Albumin: 4.5 g/dL (ref 3.5–5.5)
Alkaline Phosphatase: 75 IU/L (ref 39–117)
BUN/Creatinine Ratio: 8 — ABNORMAL LOW (ref 9–20)
BUN: 9 mg/dL (ref 6–24)
Bilirubin Total: 0.4 mg/dL (ref 0.0–1.2)
CO2: 23 mmol/L (ref 20–29)
Calcium: 9.2 mg/dL (ref 8.7–10.2)
Chloride: 102 mmol/L (ref 96–106)
Creatinine, Ser: 1.06 mg/dL (ref 0.76–1.27)
GFR calc Af Amer: 90 mL/min/{1.73_m2} (ref >59)
GFR calc non Af Amer: 78 mL/min/{1.73_m2} (ref >59)
Globulin, Total: 2 g/dL (ref 1.5–4.5)
Glucose: 131 mg/dL — ABNORMAL HIGH (ref 65–99)
Potassium: 4.6 mmol/L (ref 3.5–5.2)
Sodium: 142 mmol/L (ref 134–144)
Total Protein: 6.5 g/dL (ref 6.0–8.5)

## 2018-02-23 LAB — LIPID PANEL
Chol/HDL Ratio: 4.6 ratio (ref 0.0–5.0)
Cholesterol, Total: 183 mg/dL (ref 100–199)
HDL: 40 mg/dL (ref >39)
LDL Calculated: 106 mg/dL — ABNORMAL HIGH (ref 0–99)
Triglycerides: 186 mg/dL — ABNORMAL HIGH (ref 0–149)
VLDL Cholesterol Cal: 37 mg/dL (ref 5–40)

## 2018-03-13 ENCOUNTER — Encounter: Payer: Self-pay | Admitting: *Deleted

## 2018-05-22 ENCOUNTER — Ambulatory Visit: Payer: BLUE CROSS/BLUE SHIELD | Admitting: Emergency Medicine

## 2019-06-08 ENCOUNTER — Ambulatory Visit: Payer: Self-pay | Attending: Internal Medicine

## 2019-06-08 DIAGNOSIS — Z23 Encounter for immunization: Secondary | ICD-10-CM | POA: Insufficient documentation

## 2019-06-08 NOTE — Progress Notes (Signed)
   Covid-19 Vaccination Clinic  Name:  Jeremy Guerra    MRN: [id number] DOB: 1960-04-29  06/08/2019  [name] was observed post Covid-19 immunization for 15 minutes without incidence. He was provided with Vaccine Information Sheet and instruction to access the V-Safe system.   [name] was instructed to call 911 with any severe reactions post vaccine: Marland Kitchen Difficulty breathing  . Swelling of your face and throat  . A fast heartbeat  . A bad rash all over your body  . Dizziness and weakness    Immunizations Administered    Name Date Dose VIS Date Route   Pfizer COVID-19 Vaccine 06/08/2019  4:15 PM 0.3 mL 03/22/2019 Intramuscular   Manufacturer: [place]   Lot: [id number]   [place]: [id number]

## 2019-06-29 ENCOUNTER — Ambulatory Visit: Payer: Self-pay | Attending: Internal Medicine

## 2019-06-29 DIAGNOSIS — Z23 Encounter for immunization: Secondary | ICD-10-CM

## 2019-06-29 NOTE — Progress Notes (Signed)
   Covid-19 Vaccination Clinic  Name:  Jeremy Guerra    MRN: [id number] DOB: [birth year]  06/29/2019  [name] was observed post Covid-19 immunization for 15 minutes without incident. He was provided with Vaccine Information Sheet and instruction to access the V-Safe system.   [name] was instructed to call 911 with any severe reactions post vaccine: Marland Kitchen Difficulty breathing  . Swelling of face and throat  . A fast heartbeat  . A bad rash all over body  . Dizziness and weakness   Immunizations Administered    Name Date Dose VIS Date Route   Pfizer COVID-19 Vaccine 06/29/2019  8:47 AM 0.3 mL 03/22/2019 Intramuscular   Manufacturer: [place]   Lot: [id number]   [place]: [id number]

## 2023-12-02 NOTE — ED Provider Notes (Signed)
 [facility] Department Provider Note Room: [facility]  N ED A08/A 08  Medical Decision Making  63 year old male who presents for evaluation after witnessed syncopal episode x 2 while driving in the backseat of a car with his son.  Differential diagnosis includes cardiac syncope, neurogenic syncope, vasovagal episode, reaction to recent cannabis use, sepsis, pulmonary embolism, metabolic derangement among other etiology. Assessment & Plan Syncope and collapse Experienced a syncopal episode in the backseat of a car, characterized by slumping over and unresponsiveness for 3 to 5 seconds. He was pale and diaphoretic during the episode. No head trauma or chest pain reported. Episode may be related to cannabis use or other factors. - Order non-contrast head CT - Perform laboratory evaluation including urine sample  Acute dyspnea Reported mild dyspnea without chest pain. Dyspnea is acute and may be related to the syncopal episode or cannabis use.  Suspected adverse reaction to cannabis Smoked cannabis and consumed spiked coconut water prior to the syncopal episode. Reported stronger than expected cannabis. Suspected adverse reaction possibly due to contamination or unexpected potency. Son also smoked the same cannabis without adverse effects, suggesting individual sensitivity or contamination. - Perform urine drug screen  Acute hyperglycemia Blood glucose level elevated at 259 mg/dL. Denies diabetes and does not take medications regularly.  Patient presented for evaluation for a witnessed syncopal episode.  The patient had 2 episodes with tonic activity without obvious seizures.  He did appear altered on initial presentation.  Patient's laboratory patient revealed an elevated lactate of 2.5.  His initial blood sugar was 259 without a history of diabetes.  His CMP had no actionable results.  Cardiac troponin was 6.   Alcohol level was 13.  PT was 12.0 with an INR of 1.03.   D-dimer was elevated at 338.  White blood cell count was 13.3 with a normal differential.  CBC was otherwise nonactionable.  Patient was negative for COVID, influenza and RSV.  Urinalysis had no evidence of infection.  CT of the head was independent reviewed and revealed no evidence of acute endocrine intervention such as mass or hemorrhage.  Chest x-ray was in reviewed and revealed no evidence of infection.  There is no pneumothorax.  Patient's evaluation is remarkable for elevated lactate without evidence of infection.  The patient's D-dimer was elevated mildly at 338.  Given the patient's syncopal episode we will proceed with a CT of the chest for further evaluation.  Anticipate admission for observation for syncopal episode following CTA results.  Final disposition will be per oncoming attending physician  Progress Notes   4:40 PM at the time of transfer care oncoming attending physician, the patient is markedly improved from mental status standpoint.  He is awake and alert.  He states that he feels markedly improved at this time.  He has no headache, chest pain or shortness of breath at this time.  We had a lengthy conversation regarding potential admission for syncope which she is in agreement with pending CTA chest results.     MDM Elements   Independent Interpretation of Studies: EKG(s): See interpretation below X-ray(s): Chest x-rays) reviewed revealed no evidence of acute MI including infiltrate or pneumothorax. CT scan(s): Head CT is independent reviewed revealed no evidence of acute abnormality requiring intervention such as hemorrhage or mass.              Disposition   Clinical Impression:     Diagnosis Comment Added By Time Added   Syncope and collapse  Vicenta Lamar Revere, MD 12/02/2023  4:46 PM      Final Disposition: no dispo  History   No chief complaint on file.   History of Present Illness Wael Maestas is a 63 year old male who presents with an  episode of loss of consciousness and difficulty breathing. He is accompanied by his son, who was driving him at the time of the incident.  He experienced an episode of loss of consciousness while sitting in the backseat of a car, lasting approximately 3 to 5 seconds. During this time, he was slumped over, and upon regaining consciousness, he had difficulty breathing and was sweating profusely. No chest pain, headache, or abdominal pain was noted. He felt nauseated earlier but not at the time of the conversation. There was no fall or head injury during the episode.  Prior to the incident, he consumed a spiked coconut water and smoked cannabis, which he had not done recently. He had not eaten until later than usual, consuming bread and spring rolls. He does not take any daily medications and is not aware of having diabetes, although his blood sugar was noted to be 278 mg/dL.  His son reported that he smoked cannabis about 5 to 10 minutes before the incident. During the episode, he appeared pale and sweaty, with slurred speech. His son did not experience any unusual reactions from the same cannabis.  No cough, runny nose, or recent falls. He confirmed feeling perfectly fine before getting into the car.  Addition Historian(s): Family - son     MEDICATIONS:  Patient's Medications   No medications on file    ALLERGIES: Allergies[1]  PAST MEDICAL HISTORY:  Past Medical History[2]  PAST SURGICAL HISTORY: Past Surgical History[3]  SOCIAL HISTORY:  Social History   Tobacco Use  . Smoking status: Not on file  . Smokeless tobacco: Not on file  Substance Use Topics  . Alcohol use: Not on file       FAMILY HISTORY:  Family History[4]    Physical Exam    ED Triage Vitals [12/02/23 1513]  Temp 97.6 F (36.4 C)  Temp Source Oral  Heart Rate 67  BP 103/69  Respirations (!) 11  SpO2 98 %   Reviewed vital signs and nursing note as charted by RN.  Physical Exam VITALS: P- 63, BP-  100/50 Adult Medical Physical Exam Reviewed vital signs and nursing note as charted by RN.   CONSTITUTIONAL: ill-appearing; well-nourished. Alert and oriented and responds appropriately to questions. diaphoretic HEAD: Normocephalic; atraumatic EYES: PERRL 3 mm to 2 mm bilateral; Conjunctivae clear, sclerae non-icteric ENT: Normal nose; no rhinorrhea; moist mucous membranes NECK: Supple without meningismus; nontender; no masses CARD:bradycardia with a regular rhythm; no murmurs, no clicks, no rubs, no gallops; symmetric distal pulses RESP: Normal chest excursion without splinting or tachypnea; breath sounds clear and equal bilaterally; no wheezes, no rhonchi, no rales  ABD/GI: Nondistended; soft, nontender, no rebound, no guarding BACK:  Nontender to palpation EXT: Normal ROM in all joints; nontender to palpation; no cyanosis, no effusions, no edema    SKIN: Normal color for age and race; warm; dry; good turgor; capillary refill < 2 seconds; no acute lesions noted NEURO: Moves all extremities equally; Motor and sensory function intact  PSYCH: The patient's mood and manner are appropriate. Grooming and personal hygiene are appropriate.      Results    I personally reviewed the results in the chart as listed below  Results for orders placed or performed  during the hospital encounter of 12/02/23  COVID, RSV and Influenza A/B Amplified Probes  Result Value Ref Range   Influenza A, Amplified Probe Negative Negative   Influenza B, Amplified Probe Negative Negative   RSV, Amplified Probe Negative Negative   SARS-CoV-2 Negative Negative  CBC with Differential  Result Value Ref Range   Differential Percent Diff %    Differential Absolute Diff Absolute    WBC 13.3 (H) 3.6 - 11.2 K/uL   RBC 5.24 4.06 - 5.63 M/uL   Hemoglobin 16.8 (H) 12.5 - 16.3 g/dL   Hematocrit 46 37 - 47 %   Mean Cell Volume 89 74 - 96 fL   Mean Cell Hemoglobin 32 24 - 33 pg   Mean Cell Hemoglobin Concentration 36 33  - 36 g/dL   RDW 86.4 87.6 - 82.9 %   Platelet Count 182 150 - 450 K/uL   Mean Platelet Volume 10.3 7.5 - 11.2 fL   Neutrophils 57 43 - 77 %   Lymphocytes 34 16 - 44 %   Monocytes 6 5 - 13 %   Eosinophils 2 1 - 8 %   Basophils 1 0 - 1 %   Neutrophils Absolute 7.6 1.8 - 7.8 K/uL   Lymphocytes Absolute 4.5 (H) 1.0 - 3.0 K/uL   Monocytes Absolute 0.7 0.3 - 1.0 K/uL   Eosinophils Absolute 0.3 0.0 - 0.5 K/uL   Basophils Absolute 0.1 0.0 - 0.1 K/uL   Nucleated RBCS 1 /100 WBC  CMP  Result Value Ref Range   Sodium 137 136 - 145 mmol/L   Potassium 3.7 3.5 - 5.1 mmol/L   Chloride 102 98 - 107 mmol/L   CO2 24 21 - 31 mmol/L   BUN 12 7 - 25 mg/dL   Creatinine 8.78 9.29 - 1.30 mg/dL   Glucose, Random 740 (H) 70 - 199 mg/dL   Calcium, Total 9.4 8.6 - 10.3 mg/dL   Osmolality (calculated) 282 270 - 295 mOsm/kg   Anion Gap 11 4 - 12   Albumin 4.5 3.5 - 5.7 g/dL   Bilirubin, Total 0.9 0.3 - 1.0 mg/dL   Alkaline Phosphatase 71 34 - 104 IU/L   ALT 11 7 - 52 IU/L   AST 12 (L) 13 - 39 IU/L   Protein, Total 6.7 6.4 - 8.9 g/dL   Albumin/Globulin Ratio 2.0 1.2 - 2.3  Magnesium Level  Result Value Ref Range   Magnesium 1.9 1.7 - 2.4 mg/dL  Lactic Acid (Initial)  Result Value Ref Range   Lactic Acid 2.5 (H) 0.5 - 2.0 mmol/L  PT/INR  Result Value Ref Range   PT 12.0 9.9 - 12.7 sec   INR 1.03 <1.30  Urinalysis with Reflex Urine Culture  Result Value Ref Range   Color YELLOW Lt Yellow or Yellow   Urine Clarity CLEAR Clear   Urine Specific Gravity 1.027 1.003 - 1.035   Urine pH 5.5 5.0 - 8.0   Urine Albumin 1+ (A) Negative   Urine Glucose Screen 3+ (A) Negative   Urine Ketones TRACE (A) Negative   Urine Bilirubin NEGATIVE Negative   Urine Hemoglobin NEGATIVE Negative   Urine Leukocyte Esterase NEGATIVE Negative Leu/uL   Urine Nitrite NEGATIVE Negative   Urine Bacteria TRACE Trace /HPF   Urine RBC 3-20 (A) <3 /HPF   Urine WBC <5 <5 /HPF   Urine Squamous Epithelial Cells <10 <10 /HPF    Urine Hyaline Casts 1-5 0 - 5 /LPF  eGFR (CKD-EPI)  Result Value  Ref Range   eGFR >60 mL/min/1.72m2  Lab Add On  Result Value Ref Range   Lab Add On PROCESSED   Lab Add On  Result Value Ref Range   Lab Add On PROCESSED   D-dimer, Quantitative  Result Value Ref Range   D-Dimer, Quantitative 338 (H) <231 ng/mL DDU  Alcohol, Ethyl Level  Result Value Ref Range   Alcohol, Ethyl 13 0 - 80 mg/dL  Troponin I High Sensitivity  Result Value Ref Range   Troponin I High Sensitivity 6 0 - 18 pg/mL   Imaging Results          CT Head Without IV Contrast (In process)  Result time 12/02/23 15:50:10                     XR Chest Portable (In process)                   ECG Results          ECG 12 lead; (Final result)  Result time 12/02/23 16:09:56    Final result             Impression:   Normal sinus rhythm Incomplete right bundle branch block Borderline ECG No previous ECGs available Confirmed by MIKKI, DOUG ([id number]) on 12/02/2023 4:09:50 PM          Narrative:   Ventricular Rate: 60 Atrial Rate: 60 P-R Interval: 144 QRS Duration: 98 Q-T Interval: 420 QTC Calculation(Bazett): 420 P Axis: 69 R Axis: 31 T Axis: 66                            Vicenta Lamar MIKKI, MD 12/02/23 1701      [1] No Known Allergies [2] History reviewed. No pertinent past medical history. [3] No past surgical history on file. [4] History reviewed. No pertinent family history.

## 2023-12-02 NOTE — ED Notes (Signed)
 ED CONTINUATION OF CARE NOTE   I assumed care of the patient from the previous ED provider at change of shift.  The plan of care as discussed is to await CT scan and reassess, but expect admission.  Progress Notes   5:18 PM Patient is resting comfortably, hemodynamically stable, awaiting CT scan results and likely discussed with hospitalist for admission.  7:43 PM Some delay in CTA result, this is negative for acute pathology, at this point discussed with patient and spouse recommendation for admission for multiple syncopal episodes, and monitoring for arrhythmia with cardiology consultation.  Patient declines this, and states that he would like to go home to [facility] follow-up there, at this point will have patient ambulate to see if he has any symptoms, and if not, then if he continues to decline admission will discharge with strict precautions.  7:58 PM Patient ambulated without significant difficulty, no significant lightheadedness or dizziness, at this point once again recommended admission to the hospital, patient declines.  Will give instructions, and have patient sign out AGAINST MEDICAL ADVICE.  8:26 PM Patient son at bedside, once again explained recommendation for admission, and answered questions to the best of our ability, at this point patient continues to request AGAINST MEDICAL ADVICE disposition, will sign paperwork, precautions discussed with patient and family and they expressed understanding.  I did explain that antibiotics were prophylactic initially with his presentation, in case this could be sepsis.  At this point blood cultures are pending.  Patient never had a fever, and lactic acid improved therefore lower clinical index patient for infection, but cultures are pending.    MDM Elements Discussion of Management with other Physicians, QHP or Appropriate Source: None  Independent Interpretation of Studies: EKG(s): See below CT scan(s): Preliminary review of CT scan of  the chest without pulmonary embolism  I have reviewed recent and relevant previous record, including: Outpatient notes: Urgent care visit from 09/04/2020  Escalation of Care including OBS/Admission/Transfer was considered: Admission discussed. Patient/family declined. See progress note for additional detail. Social Drivers that significantly affected care: None Prescription drugs considered but not prescribed: Pain medications: Deferred    Disposition   Clinical Impression:  Clinical Impression:     Diagnosis Comment Added By Time Added   Syncope and collapse  Vicenta Lamar Revere, MD 12/02/2023  4:46 PM      Final Disposition: AMA (SAW PROVIDER)   Results   I personally reviewed the results in the chart as listed below  Results for orders placed or performed during the hospital encounter of 12/02/23  COVID, RSV and Influenza A/B Amplified Probes  Result Value Ref Range   Influenza A, Amplified Probe Negative Negative   Influenza B, Amplified Probe Negative Negative   RSV, Amplified Probe Negative Negative   SARS-CoV-2 Negative Negative  CBC with Differential  Result Value Ref Range   Differential Percent Diff %    Differential Absolute Diff Absolute    WBC 13.3 (H) 3.6 - 11.2 K/uL   RBC 5.24 4.06 - 5.63 M/uL   Hemoglobin 16.8 (H) 12.5 - 16.3 g/dL   Hematocrit 46 37 - 47 %   Mean Cell Volume 89 74 - 96 fL   Mean Cell Hemoglobin 32 24 - 33 pg   Mean Cell Hemoglobin Concentration 36 33 - 36 g/dL   RDW 86.4 87.6 - 82.9 %   Platelet Count 182 150 - 450 K/uL   Mean Platelet Volume 10.3 7.5 - 11.2 fL   Neutrophils 57 43 -  77 %   Lymphocytes 34 16 - 44 %   Monocytes 6 5 - 13 %   Eosinophils 2 1 - 8 %   Basophils 1 0 - 1 %   Neutrophils Absolute 7.6 1.8 - 7.8 K/uL   Lymphocytes Absolute 4.5 (H) 1.0 - 3.0 K/uL   Monocytes Absolute 0.7 0.3 - 1.0 K/uL   Eosinophils Absolute 0.3 0.0 - 0.5 K/uL   Basophils Absolute 0.1 0.0 - 0.1 K/uL   Nucleated RBCS 1 /100 WBC  CMP   Result Value Ref Range   Sodium 137 136 - 145 mmol/L   Potassium 3.7 3.5 - 5.1 mmol/L   Chloride 102 98 - 107 mmol/L   CO2 24 21 - 31 mmol/L   BUN 12 7 - 25 mg/dL   Creatinine 8.78 9.29 - 1.30 mg/dL   Glucose, Random 740 (H) 70 - 199 mg/dL   Calcium, Total 9.4 8.6 - 10.3 mg/dL   Osmolality (calculated) 282 270 - 295 mOsm/kg   Anion Gap 11 4 - 12   Albumin 4.5 3.5 - 5.7 g/dL   Bilirubin, Total 0.9 0.3 - 1.0 mg/dL   Alkaline Phosphatase 71 34 - 104 IU/L   ALT 11 7 - 52 IU/L   AST 12 (L) 13 - 39 IU/L   Protein, Total 6.7 6.4 - 8.9 g/dL   Albumin/Globulin Ratio 2.0 1.2 - 2.3  Magnesium Level  Result Value Ref Range   Magnesium 1.9 1.7 - 2.4 mg/dL  Lactic Acid (Initial)  Result Value Ref Range   Lactic Acid 2.5 (H) 0.5 - 2.0 mmol/L  PT/INR  Result Value Ref Range   PT 12.0 9.9 - 12.7 sec   INR 1.03 <1.30  Urinalysis with Reflex Urine Culture  Result Value Ref Range   Color YELLOW Lt Yellow or Yellow   Urine Clarity CLEAR Clear   Urine Specific Gravity 1.027 1.003 - 1.035   Urine pH 5.5 5.0 - 8.0   Urine Albumin 1+ (A) Negative   Urine Glucose Screen 3+ (A) Negative   Urine Ketones TRACE (A) Negative   Urine Bilirubin NEGATIVE Negative   Urine Hemoglobin NEGATIVE Negative   Urine Leukocyte Esterase NEGATIVE Negative Leu/uL   Urine Nitrite NEGATIVE Negative   Urine Bacteria TRACE Trace /HPF   Urine RBC 3-20 (A) <3 /HPF   Urine WBC <5 <5 /HPF   Urine Squamous Epithelial Cells <10 <10 /HPF   Urine Hyaline Casts 1-5 0 - 5 /LPF  eGFR (CKD-EPI)  Result Value Ref Range   eGFR >60 mL/min/1.78m2  Lab Add On  Result Value Ref Range   Lab Add On PROCESSED   Lab Add On  Result Value Ref Range   Lab Add On PROCESSED   D-dimer, Quantitative  Result Value Ref Range   D-Dimer, Quantitative 338 (H) <231 ng/mL DDU  Alcohol, Ethyl Level  Result Value Ref Range   Alcohol, Ethyl 13 0 - 80 mg/dL  Troponin I High Sensitivity  Result Value Ref Range   Troponin I High Sensitivity 6  0 - 18 pg/mL  Lactic Acid (2hrs after Initial)  Result Value Ref Range   Lactic Acid 1.0 0.5 - 2.0 mmol/L  Lab Add On  Result Value Ref Range   Lab Add On PROCESSED    Imaging Results          CTA Chest Pulmonary Embolism With IV Contrast (Final result)  Result time 12/02/23 19:14:09    Final result by Selinda Sheena Lesches, MD (  12/02/23 19:14:09)             Impression:   IMPRESSION:  No pulmonary embolism or other acute cardiopulmonary process.          Narrative:   CTA CHEST, PE PROTOCOL:  HISTORY: Elevated d-dimer. Syncope.  COMPARISON: None  INTRAVENOUS CONTRAST: 100 mL of Omnipaque 350.  TECHNIQUE: Postcontrast angiographic images were obtained of the chest. 3-D images (volume rendered, surface rendered, and/or MIP images) were produced, and stored on PACS.CT source data was analyzed using artificial intelligence software and reviewed by the radiologist.  DICOM format image data available to non-affiliated external healthcare facilities or entities on a secure, media free, reciprocally searchable basis with patient authorization for at least a 12 month period after the study.  Dose reduction technique was used on this scan by utilizing automated exposure control, adjustment of the mA and/or kV according to the patient size.  FINDINGS:  Scout film: Unremarkable  Cardiovascular: No pulmonary embolism. Normal caliber thoracic aorta. Heart size normal without pericardial effusion.  Pulmonary: There is no acute airspace disease. There is no pulmonary mass or suspicious nodule. There is no pleural effusion or pneumothorax.  Mediastinum: No mediastinal or hilar adenopathy. Central airways are clear. Esophagus is unremarkable.  Thoracic cage: No acute osseous abnormality. No chest wall contusion.  Upper Abdomen: Upper abdomen is unremarkable.                                 CT Head Without IV Contrast (Final result)  Result time 12/02/23 17:54:13     Final result by Selinda Sheena Lesches, MD (12/02/23 17:54:13)             Impression:   IMPRESSION:  No acute intracranial abnormality.          Narrative:   CT HEAD WITHOUT CONTRAST:  HISTORY:  Altered mental status  COMPARISON: None  CONTRAST: Study was performed without contrast.  TECHNIQUE: Routine axial images from the skull base to the vertex.  DICOM format image data available to non-affiliated external healthcare facilities or entities on a secure, media free, reciprocally searchable basis with patient authorization for at least a 12 month period after the study.  Dose reduction technique was used on this scan by utilizing automated exposure control, adjustment of the mA and/or kV according to the patient size.  FINDINGS:  Scout film: Unremarkable  Parenchyma: No mass, midline shift, or edema.  White matter appears normal for age.  No intracranial hemorrhage or extraaxial fluid collection. Cerebellum and brainstem are without focal abnormality. 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  Ventricular system:     Ventricles appear normal.  Additional findings:     Calvarium intact. Orbits symmetric.  Sinuses and mastoid air cells are clear.                                    XR Chest Portable (In process)                   ECG Results          ECG 12 lead; (Final result)  Result time 12/02/23 16:09:56    Final result             Impression:   Normal sinus rhythm Incomplete right bundle branch block Borderline  ECG No previous ECGs available Confirmed by MIKKI, DOUG ([id number]) on 12/02/2023 4:09:50 PM          Narrative:   Ventricular Rate: 60 Atrial Rate: 60 P-R Interval: 144 QRS Duration: 98 Q-T Interval: 420 QTC Calculation(Bazett): 420 P Axis: 69 R Axis: 31 T Axis: 66

## 2023-12-29 ENCOUNTER — Ambulatory Visit (HOSPITAL_COMMUNITY)
Admission: RE | Admit: 2023-12-29 | Discharge: 2023-12-29 | Disposition: A | Discharge status: Home / Self Care | Payer: Self-pay | Source: Ambulatory Visit | Attending: Vascular Surgery | Admitting: Vascular Surgery

## 2023-12-29 ENCOUNTER — Other Ambulatory Visit (HOSPITAL_COMMUNITY): Payer: Self-pay | Admitting: Family Medicine

## 2023-12-29 DIAGNOSIS — R55 Syncope and collapse: Secondary | ICD-10-CM | POA: Insufficient documentation
